# Patient Record
Sex: Male | Born: 1970 | Race: Black or African American | Hispanic: No | Marital: Single | Smoking: Never smoker
Health system: Southern US, Community
[De-identification: ages and names within clinical notes are randomized; demographics above are authoritative.]

## PROBLEM LIST (undated history)

## (undated) DIAGNOSIS — I10 Essential (primary) hypertension: Secondary | ICD-10-CM

## (undated) DIAGNOSIS — R7989 Other specified abnormal findings of blood chemistry: Secondary | ICD-10-CM

## (undated) DIAGNOSIS — E7800 Pure hypercholesterolemia, unspecified: Secondary | ICD-10-CM

## (undated) DIAGNOSIS — N529 Male erectile dysfunction, unspecified: Secondary | ICD-10-CM

## (undated) DIAGNOSIS — K7581 Nonalcoholic steatohepatitis (NASH): Secondary | ICD-10-CM

## (undated) DIAGNOSIS — E119 Type 2 diabetes mellitus without complications: Secondary | ICD-10-CM

## (undated) DIAGNOSIS — K219 Gastro-esophageal reflux disease without esophagitis: Secondary | ICD-10-CM

---

## 2011-09-04 ENCOUNTER — Emergency Department (HOSPITAL_BASED_OUTPATIENT_CLINIC_OR_DEPARTMENT_OTHER)
Admission: EM | Admit: 2011-09-04 | Discharge: 2011-09-04 | Disposition: A | Discharge status: Home / Self Care | Payer: Self-pay | Attending: Emergency Medicine | Admitting: Emergency Medicine

## 2011-09-04 ENCOUNTER — Encounter: Payer: Self-pay | Admitting: *Deleted

## 2011-09-04 DIAGNOSIS — K625 Hemorrhage of anus and rectum: Secondary | ICD-10-CM | POA: Insufficient documentation

## 2011-09-04 DIAGNOSIS — J329 Chronic sinusitis, unspecified: Secondary | ICD-10-CM | POA: Insufficient documentation

## 2011-09-04 DIAGNOSIS — R5381 Other malaise: Secondary | ICD-10-CM | POA: Insufficient documentation

## 2011-09-04 DIAGNOSIS — R51 Headache: Secondary | ICD-10-CM | POA: Insufficient documentation

## 2011-09-04 LAB — OCCULT BLOOD X 1 CARD TO LAB, STOOL: Fecal Occult Bld: NEGATIVE

## 2011-09-04 MED ORDER — CETIRIZINE-PSEUDOEPHEDRINE ER 5-120 MG PO TB12: 1.0000 | ORAL_TABLET | Freq: Every day | ORAL | Status: AC

## 2011-09-04 MED ORDER — LORATADINE 10 MG PO TABS
10.0000 mg | ORAL_TABLET | Freq: Once | ORAL | Status: AC
  Administered 2011-09-04: 10 mg via ORAL
  Filled 2011-09-04: qty 1

## 2011-09-04 MED ORDER — MOMETASONE FUROATE 50 MCG/ACT NA SUSP: 2.0000 | Freq: Every day | NASAL | Status: DC

## 2011-09-04 MED ORDER — AMOXICILLIN-POT CLAVULANATE 875-125 MG PO TABS: 1.0000 | ORAL_TABLET | Freq: Two times a day (BID) | ORAL | Status: AC

## 2011-09-04 MED ORDER — AMOXICILLIN-POT CLAVULANATE 875-125 MG PO TABS
1.0000 | ORAL_TABLET | Freq: Once | ORAL | Status: AC
  Administered 2011-09-04: 1 via ORAL
  Filled 2011-09-04: qty 1

## 2011-09-04 MED ORDER — GUAIFENESIN 200 MG PO TABS: 400.0000 mg | ORAL_TABLET | ORAL | Status: AC | PRN

## 2011-09-04 MED ORDER — GUAIFENESIN 100 MG/5ML PO SOLN
5.0000 mL | Freq: Once | ORAL | Status: AC
  Administered 2011-09-04: 100 mg via ORAL
  Filled 2011-09-04: qty 5

## 2011-09-04 NOTE — ED Provider Notes (Signed)
History     CSN: [id number] Arrival date & time: 09/04/2011  2:18 AM  Chief Complaint  Patient presents with  . Facial Pain  . Rectal Bleeding   HPI Comments: Previously healthy patient who presents to the emergency department with multiple complaints. His first complaint involves sinus congestion, facial pain, rhinorrhea, insomnia for the past 6 months. He states that he has tried several medications including azithromycin, several over-the-counter antihistamines and decongestants, NyQuil, and various other over-the-counter remedies. He has tried vitamin C and honey. Continues to have these symptoms which has resulted in insomnia which is why he seeks evaluation in the emergency department. He denies fevers, shortness of breath, productive cough. He also states that he's had intermittent hematochezia over the past 4 months with no abdominal or rectal pain. He noticed the blood on the toilet tissue as well as in the stool. No prior history of the same.  The history is provided by the patient. No language interpreter was used.    History reviewed. No pertinent past medical history.  History reviewed. No pertinent past surgical history.  History reviewed. No pertinent family history.  History  Substance Use Topics  . Smoking status: Never Smoker   . Smokeless tobacco: Not on file  . Alcohol Use: No      Review of Systems  Constitutional: Positive for fatigue. Negative for fever, chills, activity change and appetite change.  HENT: Positive for congestion, rhinorrhea, sneezing, postnasal drip and sinus pressure. Negative for ear pain, sore throat, neck pain, neck stiffness and ear discharge.   Eyes: Positive for discharge, redness and itching.  Respiratory: Negative for cough, chest tightness and shortness of breath.   Cardiovascular: Negative for chest pain and palpitations.  Gastrointestinal: Positive for blood in stool. Negative for nausea, vomiting, abdominal pain, diarrhea,  constipation and rectal pain.  Genitourinary: Negative for dysuria, urgency, frequency and flank pain.  Neurological: Negative for dizziness, weakness, light-headedness, numbness and headaches.  All other systems reviewed and are negative.    Physical Exam  BP 149/88  Pulse 69  Temp(Src) 97.5 F (36.4 C) (Oral)  SpO2 99%  Physical Exam  Nursing note and vitals reviewed. Constitutional: He is oriented to person, place, and time. He appears well-developed and well-nourished. No distress.  HENT:  Head: Normocephalic and atraumatic.  Right Ear: External ear normal.  Left Ear: External ear normal.  Mouth/Throat: Oropharynx is clear and moist. No oropharyngeal exudate.       Facial tenderness on palpation.  Bogginess to turbinates  Eyes: Pupils are equal, round, and reactive to light. Right eye exhibits no discharge. Left eye exhibits no discharge. Right conjunctiva is injected. Right conjunctiva has no hemorrhage. Left conjunctiva is injected. Left conjunctiva has no hemorrhage.  Neck: Normal range of motion. Neck supple.  Cardiovascular: Normal rate, regular rhythm, normal heart sounds and intact distal pulses.  Exam reveals no gallop and no friction rub.   No murmur heard. Pulmonary/Chest: Effort normal and breath sounds normal. No respiratory distress.  Abdominal: Soft. Bowel sounds are normal. There is no tenderness.  Genitourinary: Rectum normal and prostate normal. Rectal exam shows no external hemorrhoid, no internal hemorrhoid, no fissure and no tenderness. Guaiac negative stool.  Musculoskeletal: Normal range of motion. He exhibits no tenderness.  Neurological: He is alert and oriented to person, place, and time.  Skin: Skin is warm and dry. No rash noted.    ED Course  Procedures  MDM Intermittent hematochezia, sinusitis Hemoccults performed and negative. I reassured the  patient at this may be an ongoing issue and is likely secondary to constipation. He'll the patient is  suffering from sinusitis with an allergic component. I will place him on Augmentin prescribed him decongestants and Nasonex. He is instructed to followup with her primary care physician as this may be an ongoing issue. I stressed to him there is no indication for sleeping at this time and that Benadryl would be appropriate for his sleep. There is no emergent conditions at this time. There is no indication for chest x-ray the patient has no cough, fever. There is no indication for CBCs the patient has no rectal bleeding and has been minimal.      Trisha Mangle, MD 09/04/11 [phone]

## 2011-09-04 NOTE — ED Notes (Signed)
Pt states that he has had sinus congestion x 6 months pt also with small amount of blood in stools x 4 months ago denies abd pain or rectal pain

## 2017-12-16 ENCOUNTER — Emergency Department (HOSPITAL_BASED_OUTPATIENT_CLINIC_OR_DEPARTMENT_OTHER)
Admission: EM | Admit: 2017-12-16 | Discharge: 2017-12-16 | Disposition: A | Discharge status: Home / Self Care | Payer: Self-pay | Attending: Emergency Medicine | Admitting: Emergency Medicine

## 2017-12-16 ENCOUNTER — Encounter (HOSPITAL_BASED_OUTPATIENT_CLINIC_OR_DEPARTMENT_OTHER): Payer: Self-pay | Admitting: Emergency Medicine

## 2017-12-16 ENCOUNTER — Other Ambulatory Visit: Payer: Self-pay

## 2017-12-16 DIAGNOSIS — E119 Type 2 diabetes mellitus without complications: Secondary | ICD-10-CM | POA: Insufficient documentation

## 2017-12-16 DIAGNOSIS — R202 Paresthesia of skin: Secondary | ICD-10-CM | POA: Insufficient documentation

## 2017-12-16 LAB — BASIC METABOLIC PANEL
ANION GAP: 10 (ref 5–15)
BUN: 14 mg/dL (ref 6–20)
CHLORIDE: 93 mmol/L — AB (ref 101–111)
CO2: 28 mmol/L (ref 22–32)
Calcium: 9.4 mg/dL (ref 8.9–10.3)
Creatinine, Ser: 1.32 mg/dL — ABNORMAL HIGH (ref 0.61–1.24)
GFR calc Af Amer: >60 mL/min (ref >60)
Glucose, Bld: 462 mg/dL — ABNORMAL HIGH (ref 65–99)
POTASSIUM: 4.2 mmol/L (ref 3.5–5.1)
SODIUM: 131 mmol/L — AB (ref 135–145)

## 2017-12-16 LAB — CBC WITH DIFFERENTIAL/PLATELET
BASOS ABS: 0 10*3/uL (ref 0.0–0.1)
Basophils Relative: 0 %
Eosinophils Absolute: 0.2 10*3/uL (ref 0.0–0.7)
Eosinophils Relative: 3 %
HEMATOCRIT: 42.8 % (ref 39.0–52.0)
HEMOGLOBIN: 14.6 g/dL (ref 13.0–17.0)
Lymphocytes Relative: 35 %
Lymphs Abs: 2.6 10*3/uL (ref 0.7–4.0)
MCH: 28.5 pg (ref 26.0–34.0)
MCHC: 34.1 g/dL (ref 30.0–36.0)
MCV: 83.4 fL (ref 78.0–100.0)
Monocytes Absolute: 0.4 10*3/uL (ref 0.1–1.0)
Monocytes Relative: 6 %
NEUTROS ABS: 4.1 10*3/uL (ref 1.7–7.7)
NEUTROS PCT: 56 %
PLATELETS: 223 10*3/uL (ref 150–400)
RBC: 5.13 MIL/uL (ref 4.22–5.81)
RDW: 12.2 % (ref 11.5–15.5)
WBC: 7.3 10*3/uL (ref 4.0–10.5)

## 2017-12-16 LAB — URINALYSIS, ROUTINE W REFLEX MICROSCOPIC
Bilirubin Urine: NEGATIVE
Ketones, ur: 15 mg/dL — AB
LEUKOCYTES UA: NEGATIVE
Nitrite: NEGATIVE
Protein, ur: NEGATIVE mg/dL
SPECIFIC GRAVITY, URINE: 1.015 (ref 1.005–1.030)
pH: 5.5 (ref 5.0–8.0)

## 2017-12-16 LAB — URINALYSIS, MICROSCOPIC (REFLEX)

## 2017-12-16 MED ORDER — METFORMIN HCL 500 MG PO TABS: 500.0000 mg | ORAL_TABLET | Freq: Two times a day (BID) | ORAL | 0 refills | Status: DC

## 2017-12-16 NOTE — Discharge Instructions (Signed)
You were seen in the ED with symptoms concerning for diabetes. Your blood sugar was elevated and you need to be started on medication as well as making diet and exercise changes. You will need to see a PCP ASAP and I have listed several options for you on your discharge paperwork.   Return to the ED with any severe vomiting, abdominal pain, or other concerning symptoms.

## 2017-12-16 NOTE — ED Triage Notes (Signed)
PT presents with c/o blurred vision and tingling on entire face for a week.

## 2017-12-16 NOTE — ED Provider Notes (Signed)
Emergency Department Provider Note   I have reviewed the triage vital signs and the nursing notes.   HISTORY  Chief Complaint Blurred Vision and Tingling   HPI Craig Mclean is a 46 y.o. male to the emergency department for evaluation multiple complaints including dry mouth, urine frequency, blurred vision, lightheadedness, tingling over the middle of the face.  Symptoms have been worsening over the last week.  He denies any tingling or numbness on one side of the body or the other.  No fevers or chills.  No new medications.  Patient states that he was checked for diabetes 3 months ago and was told he was okay.  No pain in the arms or legs.  He states when he drinks food with lots of acid he feels pain in his mouth but otherwise is able to tolerate solids and liquids.  States he does drink candy but is been trying to eat more frequent recently.  No difficulty walking.   History reviewed. No pertinent past medical history.  There are no active problems to display for this patient.   History reviewed. No pertinent surgical history.  Current Outpatient Rx  . Order #: [id number] Class: Print  . Order #: [id number] Class: Print    Allergies Patient has no known allergies.  No family history on file.  Social History Social History   Tobacco Use  . Smoking status: Never Smoker  Substance Use Topics  . Alcohol use: No  . Drug use: No    Review of Systems  Constitutional: No fever/chills Eyes: Positive blurred vision.  ENT: No sore throat. Positive dry/painful mouth.  Cardiovascular: Denies chest pain. Respiratory: Denies shortness of breath. Gastrointestinal: No abdominal pain.  No nausea, no vomiting.  No diarrhea.  No constipation. Genitourinary: Negative for dysuria. Positive urine frequency.  Musculoskeletal: Negative for back pain. Skin: Negative for rash. Neurological: Negative for headaches, focal weakness or numbness. Positive face tingling.   10-point ROS  otherwise negative.  ____________________________________________   PHYSICAL EXAM:  VITAL SIGNS: ED Triage Vitals  Enc Vitals Group     BP 12/16/17 1918 (!) 133/108     Pulse Rate 12/16/17 1918 (!) 103     Resp 12/16/17 1918 20     Temp 12/16/17 1918 98 F (36.7 C)     Temp Source 12/16/17 1918 Oral     SpO2 12/16/17 1918 99 %     Pain Score 12/16/17 1933 0   Constitutional: Alert and oriented. Well appearing and in no acute distress. Eyes: Conjunctivae are normal.  Head: Atraumatic. Nose: No congestion/rhinnorhea. Mouth/Throat: Mucous membranes are dry. Neck: No stridor.  Cardiovascular: Tachycardia. Good peripheral circulation. Grossly normal heart sounds.   Respiratory: Normal respiratory effort.  No retractions. Lungs CTAB. Gastrointestinal: Soft and nontender. No distention.  Musculoskeletal: No lower extremity tenderness nor edema. No gross deformities of extremities. Neurologic:  Normal speech and language. No gross focal neurologic deficits are appreciated.  Skin:  Skin is warm, dry and intact. No rash noted.  ____________________________________________   LABS (all labs ordered are listed, but only abnormal results are displayed)  Labs Reviewed  BASIC METABOLIC PANEL - Abnormal; Notable for the following components:      Result Value   Sodium 131 (*)    Chloride 93 (*)    Glucose, Bld 462 (*)    Creatinine, Ser 1.32 (*)    All other components within normal limits  URINALYSIS, ROUTINE W REFLEX MICROSCOPIC - Abnormal; Notable for the following components:   Glucose,  UA >=500 (*)    Hgb urine dipstick TRACE (*)    Ketones, ur 15 (*)    All other components within normal limits  URINALYSIS, MICROSCOPIC (REFLEX) - Abnormal; Notable for the following components:   Bacteria, UA RARE (*)    Squamous Epithelial / LPF 0-5 (*)    All other components within normal limits  CBC WITH DIFFERENTIAL/PLATELET    ____________________________________________   PROCEDURES  Procedure(s) performed:   Procedures  None ____________________________________________   INITIAL IMPRESSION / ASSESSMENT AND PLAN / ED COURSE  Pertinent labs & imaging results that were available during my care of the patient were reviewed by me and considered in my medical decision making (see chart for details).  Patient has no focal neurological deficits.  He is describing tingling in his bilateral face.  No weakness or other cranial nerve deficit.  Equal strength bilaterally.  Normal gait.  Have somewhat increased suspicion for possible new onset diabetes.  Plan to obtain lab work and urine test as well as visual acuity.  Patient does not require any advanced imaging of the head at this time.   Patient with elevated CBG but no DKA. Plan to start Metformin and refer to PCP. Discussed diet changes and need for urgent follow up. Discussed signs/symptoms of DKA and return precautions in detail.   At this time, I do not feel there is any life-threatening condition present. I have reviewed and discussed all results (EKG, imaging, lab, urine as appropriate), exam findings with patient. I have reviewed nursing notes and appropriate previous records.  I feel the patient is safe to be discharged home without further emergent workup. Discussed usual and customary return precautions. Patient and family (if present) verbalize understanding and are comfortable with this plan.  Patient will follow-up with their primary care provider. If they do not have a primary care provider, information for follow-up has been provided to them. All questions have been answered.  ____________________________________________  FINAL CLINICAL IMPRESSION(S) / ED DIAGNOSES  Final diagnoses:  Type 2 diabetes mellitus without complication, without Keaja Reaume-term current use of insulin ([facility])     NEW OUTPATIENT MEDICATIONS STARTED DURING THIS  VISIT:  Metformin   Note:  This document was prepared using Dragon voice recognition software and may include unintentional dictation errors.  Nanda Quinton, MD Emergency Medicine    Kieth Hartis, Wonda Olds, MD 12/17/17 [phone]

## 2017-12-31 ENCOUNTER — Emergency Department (HOSPITAL_BASED_OUTPATIENT_CLINIC_OR_DEPARTMENT_OTHER)
Admission: EM | Admit: 2017-12-31 | Discharge: 2017-12-31 | Disposition: A | Discharge status: Home / Self Care | Payer: BLUE CROSS/BLUE SHIELD | Attending: Physician Assistant | Admitting: Physician Assistant

## 2017-12-31 ENCOUNTER — Encounter (HOSPITAL_BASED_OUTPATIENT_CLINIC_OR_DEPARTMENT_OTHER): Payer: Self-pay | Admitting: *Deleted

## 2017-12-31 ENCOUNTER — Other Ambulatory Visit: Payer: Self-pay

## 2017-12-31 ENCOUNTER — Emergency Department (HOSPITAL_BASED_OUTPATIENT_CLINIC_OR_DEPARTMENT_OTHER): Payer: BLUE CROSS/BLUE SHIELD

## 2017-12-31 DIAGNOSIS — R05 Cough: Secondary | ICD-10-CM | POA: Insufficient documentation

## 2017-12-31 DIAGNOSIS — Z79899 Other long term (current) drug therapy: Secondary | ICD-10-CM | POA: Diagnosis not present

## 2017-12-31 DIAGNOSIS — R059 Cough, unspecified: Secondary | ICD-10-CM

## 2017-12-31 MED ORDER — OMEPRAZOLE 20 MG PO CPDR: 20.0000 mg | DELAYED_RELEASE_CAPSULE | Freq: Every day | ORAL | 0 refills | Status: DC

## 2017-12-31 NOTE — ED Provider Notes (Signed)
[facility] EMERGENCY DEPARTMENT Provider Note   CSN: [id number] Arrival date & time: 12/31/17  0148     History   Chief Complaint Chief Complaint  Patient presents with  . Cough    HPI Craig Mclean is a 47 y.o. male.  HPI   Patient is a 47 year old male presenting with cough.  Patient reports the cough is during the day and night.  He has not noticed any congestion fever or other symptoms patient says he has been having more reflux symptoms..  When I went to evaluate patient he was very sleepy, unable to wake up and really talk to me at this time.  Most likely just secondary to be in the middle the night.  History reviewed. No pertinent past medical history.  There are no active problems to display for this patient.   History reviewed. No pertinent surgical history.     Home Medications    Prior to Admission medications   Medication Sig Start Date End Date Taking? Authorizing Provider  metFORMIN (GLUCOPHAGE) 500 MG tablet Take 1 tablet (500 mg total) by mouth 2 (two) times daily with a meal. 12/16/17 01/15/18 Yes Long, Wonda Olds, MD  mometasone (NASONEX) 50 MCG/ACT nasal spray Place 2 sprays into the nose daily. 09/04/11 09/03/12  Trisha Mangle, MD    Family History No family history on file.  Social History Social History   Tobacco Use  . Smoking status: Never Smoker  . Smokeless tobacco: Never Used  Substance Use Topics  . Alcohol use: No  . Drug use: No     Allergies   Patient has no known allergies.   Review of Systems Review of Systems  Constitutional: Negative for fatigue and fever.  HENT: Negative for congestion.   Respiratory: Positive for cough.      Physical Exam Updated Vital Signs BP (!) 151/95 (BP Location: Left Arm)   Pulse 84   Temp 98.2 F (36.8 C) (Oral)   Resp 20   SpO2 97%   Physical Exam  Constitutional: He is oriented to person, place, and time. He appears well-nourished.  HENT:  Head: Normocephalic.  Eyes:  Conjunctivae are normal. Right eye exhibits no discharge. Left eye exhibits no discharge.  Cardiovascular: Regular rhythm.  No murmur heard. Pulmonary/Chest: Effort normal and breath sounds normal. No respiratory distress.  Neurological: He is oriented to person, place, and time.  Skin: Skin is warm and dry. He is not diaphoretic.  Psychiatric: He has a normal mood and affect. His behavior is normal.     ED Treatments / Results  Labs (all labs ordered are listed, but only abnormal results are displayed) Labs Reviewed - No data to display  EKG  EKG Interpretation None       Radiology Dg Chest 2 View  Result Date: 12/31/2017 CLINICAL DATA:  47 y/o  M; cough and congestion. EXAM: CHEST  2 VIEW COMPARISON:  None. FINDINGS: The heart size and mediastinal contours are within normal limits. Both lungs are clear. The visualized skeletal structures are unremarkable. IMPRESSION: No active cardiopulmonary disease. Electronically Signed   By: Kristine Garbe M.D.   On: 12/31/2017 02:51    Procedures Procedures (including critical care time)  Medications Ordered in ED Medications - No data to display   Initial Impression / Assessment and Plan / ED Course  I have reviewed the triage vital signs and the nursing notes.  Pertinent labs & imaging results that were available during my care of the patient were  reviewed by me and considered in my medical decision making (see chart for details).      Patient is a 47 year old male presenting with cough.  Patient reports the cough is during the day and night.  He has not noticed any congestion fever or other symptoms patient says he has been having more reflux symptoms..  When I went to evaluate patient he was very sleepy, unable to wake up and really talk to me at this time.  Most likely just secondary to be in the middle the night.  Given that the cough has no other symptoms and x-rays negative I think is less likely to be infectious  as origin.  Could be that this is a cough related to GERD.  Will treat with omeprazole and have him follow-up with primary care physician.  Final Clinical Impressions(s) / ED Diagnoses   Final diagnoses:  None    ED Discharge Orders    None       Macarthur Critchley, MD 12/31/17 [phone]

## 2017-12-31 NOTE — ED Notes (Signed)
MD with pt  

## 2017-12-31 NOTE — Discharge Instructions (Signed)
Your chest x-ray was normal.  We think that your cough could be secondary to reflux.  Please use the medications provided to help control your symptoms monitor symptoms and return to follow-up with her primary care physician.

## 2017-12-31 NOTE — ED Triage Notes (Signed)
Cough x 1 week. Denies fever. Using OTC medications with minimal relief

## 2018-01-07 ENCOUNTER — Encounter (HOSPITAL_BASED_OUTPATIENT_CLINIC_OR_DEPARTMENT_OTHER): Payer: Self-pay | Admitting: Emergency Medicine

## 2018-01-07 ENCOUNTER — Other Ambulatory Visit: Payer: Self-pay

## 2018-01-07 ENCOUNTER — Emergency Department (HOSPITAL_BASED_OUTPATIENT_CLINIC_OR_DEPARTMENT_OTHER)
Admission: EM | Admit: 2018-01-07 | Discharge: 2018-01-08 | Disposition: A | Discharge status: Home / Self Care | Payer: BLUE CROSS/BLUE SHIELD | Attending: Emergency Medicine | Admitting: Emergency Medicine

## 2018-01-07 DIAGNOSIS — E1165 Type 2 diabetes mellitus with hyperglycemia: Secondary | ICD-10-CM | POA: Insufficient documentation

## 2018-01-07 DIAGNOSIS — R739 Hyperglycemia, unspecified: Secondary | ICD-10-CM

## 2018-01-07 DIAGNOSIS — Z79899 Other long term (current) drug therapy: Secondary | ICD-10-CM | POA: Insufficient documentation

## 2018-01-07 HISTORY — DX: Type 2 diabetes mellitus without complications: E11.9

## 2018-01-07 LAB — URINALYSIS, ROUTINE W REFLEX MICROSCOPIC
Bilirubin Urine: NEGATIVE
Glucose, UA: >=500 mg/dL — AB
HGB URINE DIPSTICK: NEGATIVE
Ketones, ur: NEGATIVE mg/dL
Leukocytes, UA: NEGATIVE
NITRITE: NEGATIVE
PROTEIN: NEGATIVE mg/dL
Specific Gravity, Urine: <1.005 — ABNORMAL LOW (ref 1.005–1.030)
pH: 6 (ref 5.0–8.0)

## 2018-01-07 LAB — CBG MONITORING, ED
GLUCOSE-CAPILLARY: 481 mg/dL — AB (ref 65–99)
Glucose-Capillary: 297 mg/dL — ABNORMAL HIGH (ref 65–99)

## 2018-01-07 LAB — URINALYSIS, MICROSCOPIC (REFLEX): RBC / HPF: NONE SEEN RBC/hpf (ref 0–5)

## 2018-01-07 LAB — BASIC METABOLIC PANEL
ANION GAP: 8 (ref 5–15)
BUN: 10 mg/dL (ref 6–20)
CALCIUM: 9 mg/dL (ref 8.9–10.3)
CO2: 28 mmol/L (ref 22–32)
Chloride: 96 mmol/L — ABNORMAL LOW (ref 101–111)
Creatinine, Ser: 1.2 mg/dL (ref 0.61–1.24)
GFR calc Af Amer: >60 mL/min (ref >60)
Glucose, Bld: 579 mg/dL (ref 65–99)
Potassium: 4.3 mmol/L (ref 3.5–5.1)
SODIUM: 132 mmol/L — AB (ref 135–145)

## 2018-01-07 LAB — CBC
HCT: 40.9 % (ref 39.0–52.0)
HEMOGLOBIN: 13.5 g/dL (ref 13.0–17.0)
MCH: 28.7 pg (ref 26.0–34.0)
MCHC: 33 g/dL (ref 30.0–36.0)
MCV: 86.8 fL (ref 78.0–100.0)
PLATELETS: 203 10*3/uL (ref 150–400)
RBC: 4.71 MIL/uL (ref 4.22–5.81)
RDW: 12.7 % (ref 11.5–15.5)
WBC: 4.8 10*3/uL (ref 4.0–10.5)

## 2018-01-07 MED ORDER — INSULIN REGULAR HUMAN 100 UNIT/ML IJ SOLN
7.0000 [IU] | Freq: Once | INTRAMUSCULAR | Status: AC
  Administered 2018-01-07: 7 [IU] via INTRAVENOUS
  Filled 2018-01-07: qty 1

## 2018-01-07 MED ORDER — SODIUM CHLORIDE 0.9 % IV BOLUS (SEPSIS)
1000.0000 mL | Freq: Once | INTRAVENOUS | Status: AC
  Administered 2018-01-07: 1000 mL via INTRAVENOUS

## 2018-01-07 MED ORDER — METFORMIN HCL 500 MG PO TABS: 500.0000 mg | ORAL_TABLET | Freq: Two times a day (BID) | ORAL | 0 refills | Status: DC

## 2018-01-07 NOTE — ED Notes (Signed)
Pt refuses to remain in the room. Going back and forth from room to waiting room.

## 2018-01-07 NOTE — ED Notes (Signed)
Date and time results received: 01/07/18 1853 (use smartphrase ".now" to insert current time)  Test: GLU Critical Value: 579  Name of Provider Notified: Dr Billy Fischer  Orders Received? Or Actions Taken?: Pt receiving IV fluids

## 2018-01-07 NOTE — ED Notes (Signed)
ED Provider at bedside. 

## 2018-01-07 NOTE — ED Triage Notes (Signed)
Pt sts needs refill on Metformin; out x 1 wk

## 2018-01-07 NOTE — ED Notes (Signed)
Alert, NAD, calm, interactive, resps e/u, speaking in clear complete sentences, no dyspnea noted, skin W&D, VSS, admits to some blurry vision, (denies: pain, sob, nausea, or dizziness).

## 2018-01-08 NOTE — ED Provider Notes (Signed)
[facility] EMERGENCY DEPARTMENT Provider Note   CSN: [id number] Arrival date & time: 01/07/18  1708     History   Chief Complaint Chief Complaint  Patient presents with  . Medication Refill  . Hyperglycemia    HPI Craig Mclean is a 47 y.o. male.  HPI   47 year old male with history of diabetes diagnosed weeks ago presents with increased urination, thirst, and blurred vision.  Reports that he ran out of his metformin prescription in about 2 weeks ago, and though he could adjust his diet and be ok,however he noted 4 days ago increased thirst and urination. Blurred vision is bilateral, no peripheral field def or double vision.  No fevers or other concerns. MIld cough for 2 weeks.  No dyspnea, no vomiting or nausea. No numbness, weakness, troublt walking or talking.  Past Medical History:  Diagnosis Date  . Diabetes mellitus without complication ([place])     There are no active problems to display for this patient.   History reviewed. No pertinent surgical history.     Home Medications    Prior to Admission medications   Medication Sig Start Date End Date Taking? Authorizing Provider  metFORMIN (GLUCOPHAGE) 500 MG tablet Take 1 tablet (500 mg total) by mouth 2 (two) times daily with a meal. 01/07/18 02/06/18  Gareth Morgan, MD  mometasone (NASONEX) 50 MCG/ACT nasal spray Place 2 sprays into the nose daily. 09/04/11 09/03/12  Trisha Mangle, MD  omeprazole (PRILOSEC) 20 MG capsule Take 1 capsule (20 mg total) by mouth daily. 12/31/17   Mackuen, Fredia Sorrow, MD    Family History No family history on file.  Social History Social History   Tobacco Use  . Smoking status: Never Smoker  . Smokeless tobacco: Never Used  Substance Use Topics  . Alcohol use: No  . Drug use: No     Allergies   Patient has no known allergies.   Review of Systems Review of Systems  Constitutional: Negative for fever.  HENT: Negative for sore throat.   Eyes: Positive for visual  disturbance.  Respiratory: Positive for cough. Negative for shortness of breath.   Cardiovascular: Negative for chest pain.  Gastrointestinal: Negative for abdominal pain, nausea and vomiting.  Endocrine: Positive for polydipsia and polyuria.  Genitourinary: Positive for frequency. Negative for difficulty urinating.  Musculoskeletal: Negative for back pain and neck stiffness.  Skin: Negative for rash.  Neurological: Negative for syncope, facial asymmetry, speech difficulty, weakness and headaches.     Physical Exam Updated Vital Signs BP (!) 130/102   Pulse 81   Temp 98.3 F (36.8 C) (Oral)   Resp 18   Ht 5' 11"  (1.803 m)   Wt 111.1 kg (245 lb)   SpO2 100%   BMI 34.17 kg/m   Physical Exam  Constitutional: He is oriented to person, place, and time. He appears well-developed and well-nourished. No distress.  HENT:  Head: Normocephalic and atraumatic.  Eyes: Conjunctivae and EOM are normal.  Neck: Normal range of motion.  Cardiovascular: Normal rate, regular rhythm, normal heart sounds and intact distal pulses. Exam reveals no gallop and no friction rub.  No murmur heard. Pulmonary/Chest: Effort normal and breath sounds normal. No respiratory distress. He has no wheezes. He has no rales.  Abdominal: Soft. He exhibits no distension. There is no tenderness. There is no guarding.  Musculoskeletal: He exhibits no edema.  Neurological: He is alert and oriented to person, place, and time. He has normal strength. No cranial nerve deficit or  sensory deficit. Coordination and gait normal. GCS eye subscore is 4. GCS verbal subscore is 5. GCS motor subscore is 6.  Skin: Skin is warm and dry. He is not diaphoretic.  Nursing note and vitals reviewed.    ED Treatments / Results  Labs (all labs ordered are listed, but only abnormal results are displayed) Labs Reviewed  BASIC METABOLIC PANEL - Abnormal; Notable for the following components:      Result Value   Sodium 132 (*)    Chloride  96 (*)    Glucose, Bld 579 (*)    All other components within normal limits  URINALYSIS, ROUTINE W REFLEX MICROSCOPIC - Abnormal; Notable for the following components:   Specific Gravity, Urine <1.005 (*)    Glucose, UA >=500 (*)    All other components within normal limits  URINALYSIS, MICROSCOPIC (REFLEX) - Abnormal; Notable for the following components:   Bacteria, UA RARE (*)    Squamous Epithelial / LPF 0-5 (*)    All other components within normal limits  CBG MONITORING, ED - Abnormal; Notable for the following components:   Glucose-Capillary >600 (*)    All other components within normal limits  CBG MONITORING, ED - Abnormal; Notable for the following components:   Glucose-Capillary 481 (*)    All other components within normal limits  CBG MONITORING, ED - Abnormal; Notable for the following components:   Glucose-Capillary 297 (*)    All other components within normal limits  CBC    EKG  EKG Interpretation None       Radiology No results found.  Procedures Procedures (including critical care time)  Medications Ordered in ED Medications  sodium chloride 0.9 % bolus 1,000 mL (0 mLs Intravenous Stopped 01/07/18 1927)  sodium chloride 0.9 % bolus 1,000 mL (0 mLs Intravenous Stopped 01/07/18 2050)  insulin regular (NOVOLIN R,HUMULIN R) 100 units/mL injection 7 Units (7 Units Intravenous Given 01/07/18 1956)     Initial Impression / Assessment and Plan / ED Course  I have reviewed the triage vital signs and the nursing notes.  Pertinent labs & imaging results that were available during my care of the patient were reviewed by me and considered in my medical decision making (see chart for details).     47 year old male with history of diabetes diagnosed weeks ago presents with increased urination, thirst, and blurred vision.   Labs show no evidence of DKA.  Given 2 L of normal saline for rehydration, and dose of insulin, with improvement of blood glucose.  Given  prescription for metformin.  Recommend close primary care physician follow-up.  Reports blurred vision has improved to recent baseline.  No other neuro symptoms, doubt CVA.  Patient discharged in stable condition with understanding of reasons to return.   Final Clinical Impressions(s) / ED Diagnoses   Final diagnoses:  Hyperglycemia    ED Discharge Orders        Ordered    metFORMIN (GLUCOPHAGE) 500 MG tablet  2 times daily with meals     01/07/18 2103       Gareth Morgan, MD 01/08/18 0345

## 2018-01-14 ENCOUNTER — Emergency Department (HOSPITAL_BASED_OUTPATIENT_CLINIC_OR_DEPARTMENT_OTHER)
Admission: EM | Admit: 2018-01-14 | Discharge: 2018-01-15 | Disposition: A | Discharge status: Home / Self Care | Payer: BLUE CROSS/BLUE SHIELD | Attending: Emergency Medicine | Admitting: Emergency Medicine

## 2018-01-14 ENCOUNTER — Encounter (HOSPITAL_BASED_OUTPATIENT_CLINIC_OR_DEPARTMENT_OTHER): Payer: Self-pay | Admitting: Emergency Medicine

## 2018-01-14 ENCOUNTER — Other Ambulatory Visit: Payer: Self-pay

## 2018-01-14 DIAGNOSIS — E1165 Type 2 diabetes mellitus with hyperglycemia: Secondary | ICD-10-CM | POA: Diagnosis not present

## 2018-01-14 DIAGNOSIS — H538 Other visual disturbances: Secondary | ICD-10-CM | POA: Diagnosis present

## 2018-01-14 DIAGNOSIS — R739 Hyperglycemia, unspecified: Secondary | ICD-10-CM

## 2018-01-14 LAB — CBG MONITORING, ED: Glucose-Capillary: 406 mg/dL — ABNORMAL HIGH (ref 65–99)

## 2018-01-14 NOTE — ED Triage Notes (Signed)
Reports diarrhea, blurred vision, dry mouth x "a couple days". States he started a new medication for DM.

## 2018-01-15 LAB — CBC WITH DIFFERENTIAL/PLATELET
BASOS ABS: 0 10*3/uL (ref 0.0–0.1)
Basophils Relative: 0 %
Eosinophils Absolute: 0.1 10*3/uL (ref 0.0–0.7)
Eosinophils Relative: 2 %
HEMATOCRIT: 39.3 % (ref 39.0–52.0)
Hemoglobin: 13 g/dL (ref 13.0–17.0)
LYMPHS ABS: 1.7 10*3/uL (ref 0.7–4.0)
LYMPHS PCT: 21 %
MCH: 28.6 pg (ref 26.0–34.0)
MCHC: 33.1 g/dL (ref 30.0–36.0)
MCV: 86.4 fL (ref 78.0–100.0)
MONO ABS: 0.5 10*3/uL (ref 0.1–1.0)
Monocytes Relative: 5 %
NEUTROS ABS: 6 10*3/uL (ref 1.7–7.7)
Neutrophils Relative %: 72 %
Platelets: 221 10*3/uL (ref 150–400)
RBC: 4.55 MIL/uL (ref 4.22–5.81)
RDW: 12.5 % (ref 11.5–15.5)
WBC: 8.3 10*3/uL (ref 4.0–10.5)

## 2018-01-15 LAB — BASIC METABOLIC PANEL
ANION GAP: 9 (ref 5–15)
BUN: 12 mg/dL (ref 6–20)
CALCIUM: 8.8 mg/dL — AB (ref 8.9–10.3)
CO2: 24 mmol/L (ref 22–32)
CREATININE: 0.88 mg/dL (ref 0.61–1.24)
Chloride: 102 mmol/L (ref 101–111)
Glucose, Bld: 360 mg/dL — ABNORMAL HIGH (ref 65–99)
Potassium: 3.7 mmol/L (ref 3.5–5.1)
SODIUM: 135 mmol/L (ref 135–145)

## 2018-01-15 MED ORDER — METFORMIN HCL 500 MG PO TABS: 1000.0000 mg | ORAL_TABLET | Freq: Two times a day (BID) | ORAL | 0 refills | Status: AC

## 2018-01-15 MED ORDER — SODIUM CHLORIDE 0.9 % IV BOLUS (SEPSIS)
1000.0000 mL | Freq: Once | INTRAVENOUS | Status: AC
  Administered 2018-01-15: 1000 mL via INTRAVENOUS

## 2018-01-15 MED ORDER — BLOOD GLUCOSE MONITOR KIT: PACK | 0 refills | Status: AC

## 2018-01-15 NOTE — ED Notes (Addendum)
Blood obtained. IV unsuccessful x3. 2nd RN to attempt.

## 2018-01-15 NOTE — ED Notes (Signed)
Alert, NAD, calm, interactive, resps e/u, speaking in clear complete sentences, no dyspnea noted, skin W&D, VSS, c/o hunger, thirst and blurry vision x 4d, (denies: pain, sob, nausea, dizziness or visual changes).

## 2018-01-15 NOTE — ED Provider Notes (Signed)
[place] EMERGENCY DEPARTMENT Provider Note   CSN: [id number] Arrival date & time: 01/14/18  2325     History   Chief Complaint Chief Complaint  Patient presents with  . Blurred Vision    HPI Craig Mclean is a 47 y.o. male.  HPI  This is a 47 year old male with a history of diabetes who presents with increased thirst and blurred vision.  He was seen and evaluated for the same thing on January 13.  At that time he had run out of his metformin and his symptoms were thought to be related to hyperglycemia.  She reports that he has been taking his metformin daily as directed.  He was initially evaluated for similar symptoms on December 22 and that was when he was started on metformin.  He has not seen a primary physician.  He states that overall his symptoms seem to get somewhat better when he takes his metformin but he has had some persistent vision changes that have not completely cleared.  He describes blurry vision.  Denies visual field cuts.  He also reports dry mouth and thirst.  Denies increased urination.  Denies any recent illnesses.  He has not been checking his blood sugars at home.  He does report diarrhea which she relates to the metformin.  Past Medical History:  Diagnosis Date  . Diabetes mellitus without complication ([place])     There are no active problems to display for this patient.   History reviewed. No pertinent surgical history.     Home Medications    Prior to Admission medications   Medication Sig Start Date End Date Taking? Authorizing Provider  blood glucose meter kit and supplies KIT Dispense based on patient and insurance preference. Use up to four times daily as directed. (FOR ICD-9 250.00, 250.01). 01/15/18   Horton, Barbette Hair, MD  metFORMIN (GLUCOPHAGE) 500 MG tablet Take 2 tablets (1,000 mg total) by mouth 2 (two) times daily with a meal. 01/15/18 02/14/18  Horton, Barbette Hair, MD  mometasone (NASONEX) 50 MCG/ACT nasal spray Place 2  sprays into the nose daily. 09/04/11 09/03/12  Trisha Mangle, MD  omeprazole (PRILOSEC) 20 MG capsule Take 1 capsule (20 mg total) by mouth daily. 12/31/17   Mackuen, Fredia Sorrow, MD    Family History No family history on file.  Social History Social History   Tobacco Use  . Smoking status: Never Smoker  . Smokeless tobacco: Never Used  Substance Use Topics  . Alcohol use: No  . Drug use: No     Allergies   Patient has no known allergies.   Review of Systems Review of Systems  Constitutional: Negative for fever.  Eyes: Positive for visual disturbance.  Respiratory: Negative for shortness of breath.   Cardiovascular: Negative for chest pain.  Gastrointestinal: Positive for diarrhea. Negative for abdominal pain, nausea and vomiting.  Endocrine: Positive for polydipsia. Negative for polyuria.  All other systems reviewed and are negative.    Physical Exam Updated Vital Signs BP 113/69   Pulse 83   Temp 98.6 F (37 C) (Oral)   Resp 17   Ht 5' 11"  (1.803 m)   Wt 111.1 kg (245 lb)   SpO2 98%   BMI 34.17 kg/m   Physical Exam  Constitutional: He is oriented to person, place, and time. He appears well-developed and well-nourished.  HENT:  Head: Normocephalic and atraumatic.  Mucous membranes dry  Eyes: Pupils are equal, round, and reactive to light.  Pupils 5 mm and  reactive bilaterally, no visual field cuts noted, 20/25 vision bilateral eyes  Cardiovascular: Normal rate, regular rhythm and normal heart sounds.  No murmur heard. Pulmonary/Chest: Effort normal and breath sounds normal. No respiratory distress. He has no wheezes.  Abdominal: Soft. Bowel sounds are normal. There is no tenderness. There is no rebound.  Musculoskeletal: He exhibits no edema.  Neurological: He is alert and oriented to person, place, and time.  Skin: Skin is warm and dry.  Psychiatric: He has a normal mood and affect.  Nursing note and vitals reviewed.    ED Treatments / Results   Labs (all labs ordered are listed, but only abnormal results are displayed) Labs Reviewed  BASIC METABOLIC PANEL - Abnormal; Notable for the following components:      Result Value   Glucose, Bld 360 (*)    Calcium 8.8 (*)    All other components within normal limits  CBG MONITORING, ED - Abnormal; Notable for the following components:   Glucose-Capillary 406 (*)    All other components within normal limits  CBC WITH DIFFERENTIAL/PLATELET    EKG  EKG Interpretation None       Radiology No results found.  Procedures Procedures (including critical care time)  Medications Ordered in ED Medications  sodium chloride 0.9 % bolus 1,000 mL (0 mLs Intravenous Stopped 01/15/18 0221)     Initial Impression / Assessment and Plan / ED Course  I have reviewed the triage vital signs and the nursing notes.  Pertinent labs & imaging results that were available during my care of the patient were reviewed by me and considered in my medical decision making (see chart for details).    Patient presents with persistent blurred vision and diarrhea.  He reports taking his metformin as directed.  He is persistently hyperglycemic here with blood sugars greater than 400.  Suspect his blurry vision is related to this as he has no visual field cuts.  Visual acuity is 20/25.  Basic lab work obtained and patient was given a liter of fluids.  Basic lab work shows no evidence of DKA.  Blood sugar 360 prior to fluid administration.  I discussed with the patient at length the importance of follow-up with primary physician.  He has Medicaid.  [facility] follow-up was recommended.  Increase metformin to 1 g twice daily and a prescription for glucometer given.  Recommend recording blood sugars twice daily.  We discussed diet modification and the importance of hydration.  I also have referred him for formal ophthalmology evaluation.  Patient stated understanding.  After history, exam, and medical workup I feel  the patient has been appropriately medically screened and is safe for discharge home. Pertinent diagnoses were discussed with the patient. Patient was given return precautions.  Final Clinical Impressions(s) / ED Diagnoses   Final diagnoses:  Hyperglycemia  Blurry vision, bilateral    ED Discharge Orders        Ordered    metFORMIN (GLUCOPHAGE) 500 MG tablet  2 times daily with meals     01/15/18 0222    blood glucose meter kit and supplies KIT     01/15/18 0222       Horton, Barbette Hair, MD 01/15/18 [phone]

## 2018-01-15 NOTE — Discharge Instructions (Signed)
You were seen today for dry mouth and blurry vision.  This is likely related to high blood sugars.  We will increase her metformin to 1000 mg twice daily.  You need to monitor glucoses and keep a record at least 2 times a day.  You also need to follow-up with ophthalmology for an official eye exam.  Make sure to stay hydrated.  Avoid carb rich foods.

## 2018-05-06 ENCOUNTER — Encounter (HOSPITAL_BASED_OUTPATIENT_CLINIC_OR_DEPARTMENT_OTHER): Payer: Self-pay | Admitting: Emergency Medicine

## 2018-05-06 ENCOUNTER — Emergency Department (HOSPITAL_BASED_OUTPATIENT_CLINIC_OR_DEPARTMENT_OTHER)
Admission: EM | Admit: 2018-05-06 | Discharge: 2018-05-06 | Disposition: A | Discharge status: Home / Self Care | Payer: BLUE CROSS/BLUE SHIELD | Attending: Emergency Medicine | Admitting: Emergency Medicine

## 2018-05-06 ENCOUNTER — Other Ambulatory Visit: Payer: Self-pay

## 2018-05-06 DIAGNOSIS — Z79899 Other long term (current) drug therapy: Secondary | ICD-10-CM | POA: Insufficient documentation

## 2018-05-06 DIAGNOSIS — M79644 Pain in right finger(s): Secondary | ICD-10-CM | POA: Diagnosis present

## 2018-05-06 DIAGNOSIS — E119 Type 2 diabetes mellitus without complications: Secondary | ICD-10-CM | POA: Diagnosis not present

## 2018-05-06 DIAGNOSIS — Z7984 Long term (current) use of oral hypoglycemic drugs: Secondary | ICD-10-CM | POA: Diagnosis not present

## 2018-05-06 DIAGNOSIS — L03011 Cellulitis of right finger: Secondary | ICD-10-CM | POA: Diagnosis not present

## 2018-05-06 MED ORDER — CEPHALEXIN 500 MG PO CAPS: 500.0000 mg | ORAL_CAPSULE | Freq: Four times a day (QID) | ORAL | 0 refills | Status: DC

## 2018-05-06 MED ORDER — HYDROCODONE-ACETAMINOPHEN 5-325 MG PO TABS: 1.0000 | ORAL_TABLET | Freq: Four times a day (QID) | ORAL | 0 refills | Status: DC | PRN

## 2018-05-06 MED ORDER — LIDOCAINE HCL (PF) 1 % IJ SOLN
5.0000 mL | Freq: Once | INTRAMUSCULAR | Status: DC
  Filled 2018-05-06: qty 5

## 2018-05-06 NOTE — ED Provider Notes (Signed)
[place] EMERGENCY DEPARTMENT Provider Note   CSN: [id number] Arrival date & time: 05/06/18  1309     History   Chief Complaint Chief Complaint  Patient presents with  . Hand Pain    HPI Craig Mclean is a 47 y.o. male.  The history is provided by the patient.  Hand Pain  This is a new problem. Episode onset: 4-5 days ago. The problem occurs constantly. The problem has been gradually worsening. Associated symptoms comments: Pain and throbbing in the right ring finger that radiates up into the wrist.  Swelling around the nail of the finger.  Denies any injury to the finger or any pain in the hand with palpation.  No fevers or other complaints at this time.. Exacerbated by: Palpation. Nothing relieves the symptoms. Treatments tried: elevation. The treatment provided no relief.    Past Medical History:  Diagnosis Date  . Diabetes mellitus without complication ([place])     There are no active problems to display for this patient.   History reviewed. No pertinent surgical history.      Home Medications    Prior to Admission medications   Medication Sig Start Date End Date Taking? Authorizing Provider  blood glucose meter kit and supplies KIT Dispense based on patient and insurance preference. Use up to four times daily as directed. (FOR ICD-9 250.00, 250.01). 01/15/18   Horton, Barbette Hair, MD  cephALEXin (KEFLEX) 500 MG capsule Take 1 capsule (500 mg total) by mouth 4 (four) times daily. 05/06/18   Blanchie Dessert, MD  HYDROcodone-acetaminophen (NORCO/VICODIN) 5-325 MG tablet Take 1 tablet by mouth every 6 (six) hours as needed for severe pain. 05/06/18   Blanchie Dessert, MD  metFORMIN (GLUCOPHAGE) 500 MG tablet Take 2 tablets (1,000 mg total) by mouth 2 (two) times daily with a meal. 01/15/18 02/14/18  Horton, Barbette Hair, MD  mometasone (NASONEX) 50 MCG/ACT nasal spray Place 2 sprays into the nose daily. 09/04/11 09/03/12  Trisha Mangle, MD  omeprazole (PRILOSEC) 20 MG  capsule Take 1 capsule (20 mg total) by mouth daily. 12/31/17   Mackuen, Fredia Sorrow, MD    Family History History reviewed. No pertinent family history.  Social History Social History   Tobacco Use  . Smoking status: Never Smoker  . Smokeless tobacco: Never Used  Substance Use Topics  . Alcohol use: No  . Drug use: No     Allergies   Patient has no known allergies.   Review of Systems Review of Systems  All other systems reviewed and are negative.    Physical Exam Updated Vital Signs BP (!) 153/97 (BP Location: Right Arm)   Pulse 73   Temp 98 F (36.7 C) (Oral)   Resp 19   Ht 5' 11"  (1.803 m)   Wt 117.9 kg (260 lb)   SpO2 100%   BMI 36.26 kg/m   Physical Exam  Constitutional: He is oriented to person, place, and time. He appears well-developed and well-nourished. No distress.  HENT:  Head: Normocephalic and atraumatic.  Eyes: Pupils are equal, round, and reactive to light.  Cardiovascular: Normal rate.  Pulmonary/Chest: Effort normal.  Musculoskeletal: He exhibits tenderness.       Hands: Neurological: He is alert and oriented to person, place, and time.  Nursing note and vitals reviewed.    ED Treatments / Results  Labs (all labs ordered are listed, but only abnormal results are displayed) Labs Reviewed - No data to display  EKG None  Radiology No results found.  Procedures Procedures (including critical care time) INCISION AND DRAINAGE Performed by: Blanchie Dessert Consent: Verbal consent obtained. Risks and benefits: risks, benefits and alternatives were discussed Type: abscess  Body area: Right ring fingernail  Anesthesia: None  Complexity: simple With 11 blade scapel cuticule of the nail was separated from the nail around the entire nail bed with copious amts of pus removed. Drainage: purulent  Drainage amount: 55m  Packing material: none Patient tolerance: Patient tolerated the procedure well with no immediate  complications.     Medications Ordered in ED Medications - No data to display   Initial Impression / Assessment and Plan / ED Course  I have reviewed the triage vital signs and the nursing notes.  Pertinent labs & imaging results that were available during my care of the patient were reviewed by me and considered in my medical decision making (see chart for details).     Patient presenting with a paronychia involving the right ring finger.  I&D as above with 3 to 4 mL's of purulent discharge.  Patient was placed on Keflex and instructed to continue doing warm soaks.  Encouraged to return if symptoms did not resolve  Final Clinical Impressions(s) / ED Diagnoses   Final diagnoses:  Paronychia of right ring finger    ED Discharge Orders        Ordered    cephALEXin (KEFLEX) 500 MG capsule  4 times daily     05/06/18 1510    HYDROcodone-acetaminophen (NORCO/VICODIN) 5-325 MG tablet  Every 6 hours PRN     05/06/18 1512       PBlanchie Dessert MD 05/06/18 1547

## 2018-05-06 NOTE — Discharge Instructions (Signed)
Soak finger in warm water for 42mn 2-3 times a day.  Take antibiotics as prescribed.  Return or call hand surgery if area is worsening.

## 2018-05-06 NOTE — ED Triage Notes (Signed)
Patient states that he hurt his right middle finger about 2 -3 days ago, he also reports that his right ring finger hurts as well. He is very unsure about if he injured it " I dont know it just started to hurt , I may have pinched it"

## 2018-08-23 ENCOUNTER — Emergency Department (HOSPITAL_BASED_OUTPATIENT_CLINIC_OR_DEPARTMENT_OTHER)
Admission: EM | Admit: 2018-08-23 | Discharge: 2018-08-23 | Disposition: A | Discharge status: Home / Self Care | Payer: BLUE CROSS/BLUE SHIELD | Attending: Emergency Medicine | Admitting: Emergency Medicine

## 2018-08-23 ENCOUNTER — Encounter (HOSPITAL_BASED_OUTPATIENT_CLINIC_OR_DEPARTMENT_OTHER): Payer: Self-pay

## 2018-08-23 DIAGNOSIS — E119 Type 2 diabetes mellitus without complications: Secondary | ICD-10-CM | POA: Insufficient documentation

## 2018-08-23 DIAGNOSIS — H538 Other visual disturbances: Secondary | ICD-10-CM | POA: Insufficient documentation

## 2018-08-23 DIAGNOSIS — R42 Dizziness and giddiness: Secondary | ICD-10-CM | POA: Insufficient documentation

## 2018-08-23 LAB — BASIC METABOLIC PANEL
Anion gap: 10 (ref 5–15)
BUN: 16 mg/dL (ref 6–20)
CO2: 25 mmol/L (ref 22–32)
Calcium: 8.8 mg/dL — ABNORMAL LOW (ref 8.9–10.3)
Chloride: 104 mmol/L (ref 98–111)
Creatinine, Ser: 0.86 mg/dL (ref 0.61–1.24)
GFR calc Af Amer: >60 mL/min (ref >60)
GFR calc non Af Amer: >60 mL/min (ref >60)
Glucose, Bld: 140 mg/dL — ABNORMAL HIGH (ref 70–99)
Potassium: 3.5 mmol/L (ref 3.5–5.1)
Sodium: 139 mmol/L (ref 135–145)

## 2018-08-23 LAB — CBC WITH DIFFERENTIAL/PLATELET
Basophils Absolute: 0 10*3/uL (ref 0.0–0.1)
Basophils Relative: 1 %
Eosinophils Absolute: 0.1 10*3/uL (ref 0.0–0.7)
Eosinophils Relative: 2 %
HCT: 40 % (ref 39.0–52.0)
Hemoglobin: 13.5 g/dL (ref 13.0–17.0)
Lymphocytes Relative: 27 %
Lymphs Abs: 1.5 10*3/uL (ref 0.7–4.0)
MCH: 29.3 pg (ref 26.0–34.0)
MCHC: 33.8 g/dL (ref 30.0–36.0)
MCV: 86.8 fL (ref 78.0–100.0)
Monocytes Absolute: 0.4 10*3/uL (ref 0.1–1.0)
Monocytes Relative: 6 %
Neutro Abs: 3.5 10*3/uL (ref 1.7–7.7)
Neutrophils Relative %: 64 %
Platelets: 214 10*3/uL (ref 150–400)
RBC: 4.61 MIL/uL (ref 4.22–5.81)
RDW: 13 % (ref 11.5–15.5)
WBC: 5.5 10*3/uL (ref 4.0–10.5)

## 2018-08-23 LAB — CBG MONITORING, ED: GLUCOSE-CAPILLARY: 162 mg/dL — AB (ref 70–99)

## 2018-08-23 LAB — URINALYSIS, ROUTINE W REFLEX MICROSCOPIC
Bilirubin Urine: NEGATIVE
Glucose, UA: 250 mg/dL — AB
Hgb urine dipstick: NEGATIVE
Ketones, ur: 15 mg/dL — AB
Leukocytes, UA: NEGATIVE
Nitrite: NEGATIVE
Protein, ur: 100 mg/dL — AB
Specific Gravity, Urine: >1.03 — ABNORMAL HIGH (ref 1.005–1.030)
pH: 5.5 (ref 5.0–8.0)

## 2018-08-23 LAB — URINALYSIS, MICROSCOPIC (REFLEX)

## 2018-08-23 MED ORDER — SODIUM CHLORIDE 0.9 % IV BOLUS
1000.0000 mL | Freq: Once | INTRAVENOUS | Status: AC
  Administered 2018-08-23: 1000 mL via INTRAVENOUS

## 2018-08-23 NOTE — ED Triage Notes (Signed)
Pt c/o blurred vision and dizziness off and on x2 days; states thought is was his sugar

## 2018-08-23 NOTE — Discharge Instructions (Signed)
Please follow up with Dr. Valetta Close (eye doctor) Continue to drink plenty of fluids Return if you are worsening (you pass out or your vision goes out)

## 2018-08-23 NOTE — ED Provider Notes (Signed)
[facility] [place] EMERGENCY DEPARTMENT Provider Note   CSN: [id number] Arrival date & time: 08/23/18  1033     History   Chief Complaint Chief Complaint  Patient presents with  . Dizziness    blurred vision    HPI Craig Mclean is a 47 y.o. male who presents with lightheadedness and blurry vision. He is a vague historian. He states he was in his usual state of health yesterday. He worked out and afterwards started feeling lightheaded and was having bilateral blurry vision. He states he just didn't feel well but can't elaborate. He cannot tell if it is worse with certain positions or positional changes. He is unsure if the blurry vision is related to the lightheadedness. Both symptoms are intermittent. He has had this before when his blood sugar has been high but today when it was checked in triage it was 162. He has been taking his diabetes medication but hasn't taken his blood pressure medicine in over a year because he checks it at home and feels he doesn't need it. He denies any pain - no headaches, eye pain, chest pain, SOB, abdominal pain currently. He was referred to an ophthalmologist but states "they never called me back". No syncope, fever, vomiting, dysuria, blood in the stool.  HPI  Past Medical History:  Diagnosis Date  . Diabetes mellitus without complication ([place])     There are no active problems to display for this patient.   History reviewed. No pertinent surgical history.      Home Medications    Prior to Admission medications   Medication Sig Start Date End Date Taking? Authorizing Provider  blood glucose meter kit and supplies KIT Dispense based on patient and insurance preference. Use up to four times daily as directed. (FOR ICD-9 250.00, 250.01). 01/15/18   Horton, Barbette Hair, MD  HYDROcodone-acetaminophen (NORCO/VICODIN) 5-325 MG tablet Take 1 tablet by mouth every 6 (six) hours as needed for severe pain. 05/06/18   Blanchie Dessert, MD  metFORMIN  (GLUCOPHAGE) 500 MG tablet Take 2 tablets (1,000 mg total) by mouth 2 (two) times daily with a meal. 01/15/18 02/14/18  Horton, Barbette Hair, MD    Family History No family history on file.  Social History Social History   Tobacco Use  . Smoking status: Never Smoker  . Smokeless tobacco: Never Used  Substance Use Topics  . Alcohol use: No  . Drug use: No     Allergies   Patient has no known allergies.   Review of Systems Review of Systems  Constitutional: Negative for fever.  Eyes: Positive for visual disturbance. Negative for pain.  Respiratory: Negative for shortness of breath.   Cardiovascular: Negative for chest pain.  Gastrointestinal: Negative for abdominal pain, nausea and vomiting.  Genitourinary: Negative for dysuria.  Musculoskeletal: Negative for back pain.  Neurological: Positive for dizziness and light-headedness. Negative for syncope and headaches.  All other systems reviewed and are negative.    Physical Exam Updated Vital Signs BP (!) 141/100 (BP Location: Right Arm)   Pulse 85   Temp 98.3 F (36.8 C) (Oral)   Resp 18   SpO2 97%   Physical Exam  Constitutional: He is oriented to person, place, and time. He appears well-developed and well-nourished. No distress.  HENT:  Head: Normocephalic and atraumatic.  Eyes: Pupils are equal, round, and reactive to light. Conjunctivae are normal. Right eye exhibits no discharge. Left eye exhibits no discharge. No scleral icterus.  Corneal arcus bilaterally    Visual  Acuity  Right Eye Distance: 20/30 Left Eye Distance: 20/30 Bilateral Distance: 20/25     Neck: Normal range of motion.  Cardiovascular: Normal rate and regular rhythm.  Pulmonary/Chest: Effort normal and breath sounds normal. No respiratory distress.  Abdominal: He exhibits no distension.  Neurological: He is alert and oriented to person, place, and time.  Lying on stretcher in NAD. GCS 15. Speaks in a clear voice. Cranial nerves II through XII  grossly intact. 5/5 strength in all extremities. Sensation fully intact.  Bilateral finger-to-nose intact. Ambulatory. Reports increased dizziness with standing and walking  Skin: Skin is warm and dry.  Psychiatric: He has a normal mood and affect. His behavior is normal.  Nursing note and vitals reviewed.    ED Treatments / Results  Labs (all labs ordered are listed, but only abnormal results are displayed) Labs Reviewed  URINALYSIS, ROUTINE W REFLEX MICROSCOPIC - Abnormal; Notable for the following components:      Result Value   Specific Gravity, Urine >1.030 (*)    Glucose, UA 250 (*)    Ketones, ur 15 (*)    Protein, ur 100 (*)    All other components within normal limits  BASIC METABOLIC PANEL - Abnormal; Notable for the following components:   Glucose, Bld 140 (*)    Calcium 8.8 (*)    All other components within normal limits  URINALYSIS, MICROSCOPIC (REFLEX) - Abnormal; Notable for the following components:   Bacteria, UA RARE (*)    All other components within normal limits  CBG MONITORING, ED - Abnormal; Notable for the following components:   Glucose-Capillary 162 (*)    All other components within normal limits  CBC WITH DIFFERENTIAL/PLATELET    EKG None  Radiology No results found.  Procedures Procedures (including critical care time)  Medications Ordered in ED Medications  sodium chloride 0.9 % bolus 1,000 mL (0 mLs Intravenous Stopped 08/23/18 1345)     Initial Impression / Assessment and Plan / ED Course  I have reviewed the triage vital signs and the nursing notes.  Pertinent labs & imaging results that were available during my care of the patient were reviewed by me and considered in my medical decision making (see chart for details).  47 year old male with vague symptoms of dizziness/lightheaded, blurry vision and feeling unwell. He is hypertensive but otherwise vitals are normal. Orthostatics are negative. His exam is unremarkable. No neuro  deficits. CBC is normal. BMP is remarkable for mild hyperglycemia. UA has 15 ketones, 100 protein, >1.030 specific gravity. EKG is SR. He was given 1 L of fluids and feels mildly better. He reports blurry vision however visual acuity is intact. He was strongly encouraged to f/u with ophthalmology. He was given another referral and asked to hydrate. Return precautions given.  Final Clinical Impressions(s) / ED Diagnoses   Final diagnoses:  Lightheaded  Blurry vision, bilateral    ED Discharge Orders    None       Recardo Evangelist, PA-C 08/23/18 1438    Davonna Belling, MD 08/23/18 [phone]

## 2018-12-23 ENCOUNTER — Emergency Department (HOSPITAL_BASED_OUTPATIENT_CLINIC_OR_DEPARTMENT_OTHER): Payer: Self-pay

## 2018-12-23 ENCOUNTER — Emergency Department (HOSPITAL_BASED_OUTPATIENT_CLINIC_OR_DEPARTMENT_OTHER)
Admission: EM | Admit: 2018-12-23 | Discharge: 2018-12-23 | Disposition: A | Discharge status: Home / Self Care | Payer: Self-pay | Attending: Emergency Medicine | Admitting: Emergency Medicine

## 2018-12-23 ENCOUNTER — Encounter (HOSPITAL_BASED_OUTPATIENT_CLINIC_OR_DEPARTMENT_OTHER): Payer: Self-pay | Admitting: Emergency Medicine

## 2018-12-23 ENCOUNTER — Other Ambulatory Visit: Payer: Self-pay

## 2018-12-23 DIAGNOSIS — I159 Secondary hypertension, unspecified: Secondary | ICD-10-CM | POA: Insufficient documentation

## 2018-12-23 DIAGNOSIS — H538 Other visual disturbances: Secondary | ICD-10-CM | POA: Insufficient documentation

## 2018-12-23 DIAGNOSIS — E119 Type 2 diabetes mellitus without complications: Secondary | ICD-10-CM | POA: Insufficient documentation

## 2018-12-23 LAB — CBC WITH DIFFERENTIAL/PLATELET
Abs Immature Granulocytes: 0 10*3/uL (ref 0.00–0.07)
BASOS PCT: 0 %
Basophils Absolute: 0 10*3/uL (ref 0.0–0.1)
EOS PCT: 2 %
Eosinophils Absolute: 0.1 10*3/uL (ref 0.0–0.5)
HCT: 46.6 % (ref 39.0–52.0)
Hemoglobin: 14.5 g/dL (ref 13.0–17.0)
Immature Granulocytes: 0 %
Lymphocytes Relative: 34 %
Lymphs Abs: 2.2 10*3/uL (ref 0.7–4.0)
MCH: 27.5 pg (ref 26.0–34.0)
MCHC: 31.1 g/dL (ref 30.0–36.0)
MCV: 88.3 fL (ref 80.0–100.0)
MONO ABS: 0.3 10*3/uL (ref 0.1–1.0)
MONOS PCT: 4 %
Neutro Abs: 3.8 10*3/uL (ref 1.7–7.7)
Neutrophils Relative %: 60 %
PLATELETS: 202 10*3/uL (ref 150–400)
RBC: 5.28 MIL/uL (ref 4.22–5.81)
RDW: 12 % (ref 11.5–15.5)
WBC: 6.4 10*3/uL (ref 4.0–10.5)
nRBC: 0 % (ref 0.0–0.2)

## 2018-12-23 LAB — BASIC METABOLIC PANEL
Anion gap: 10 (ref 5–15)
BUN: 18 mg/dL (ref 6–20)
CALCIUM: 8.8 mg/dL — AB (ref 8.9–10.3)
CO2: 26 mmol/L (ref 22–32)
CREATININE: 1.29 mg/dL — AB (ref 0.61–1.24)
Chloride: 100 mmol/L (ref 98–111)
GFR calc Af Amer: >60 mL/min (ref >60)
GFR calc non Af Amer: >60 mL/min (ref >60)
GLUCOSE: 324 mg/dL — AB (ref 70–99)
Potassium: 4.1 mmol/L (ref 3.5–5.1)
SODIUM: 136 mmol/L (ref 135–145)

## 2018-12-23 LAB — CBG MONITORING, ED: GLUCOSE-CAPILLARY: 324 mg/dL — AB (ref 70–99)

## 2018-12-23 MED ORDER — SODIUM CHLORIDE 0.9 % IV BOLUS
500.0000 mL | Freq: Once | INTRAVENOUS | Status: AC
  Administered 2018-12-23: 500 mL via INTRAVENOUS

## 2018-12-23 NOTE — ED Notes (Signed)
Pt denies having any double-vision at this time.

## 2018-12-23 NOTE — Discharge Instructions (Signed)
As we discussed, though you do have high blood pressure (hypertension), fortunately it is not immediately dangerous at this time and does not need emergency intervention or admission to the hospital.  If we add to or change your regular medications, we may cause more harm than good - it is more appropriate for your primary care doctor to evaluate you in clinic and decide if any medication changes are needed.  Please follow up in clinic as recommended in these papers.    Call the eye doctor tomorrow to schedule a follow up appointment   Return to the Emergency Department (ED) if you experience any worsening chest pain/pressure/tightness, difficulty breathing, or sudden sweating, or other symptoms that concern you.   Hypertension Hypertension, commonly called high blood pressure, is when the force of blood pumping through your arteries is too strong. Your arteries are the blood vessels that carry blood from your heart throughout your body. A blood pressure reading consists of a higher number over a lower number, such as 110/72. The higher number (systolic) is the pressure inside your arteries when your heart pumps. The lower number (diastolic) is the pressure inside your arteries when your heart relaxes. Ideally you want your blood pressure below 120/80. Hypertension forces your heart to work harder to pump blood. Your arteries may become narrow or stiff. Having hypertension puts you at risk for heart disease, stroke, and other problems.  RISK FACTORS Some risk factors for high blood pressure are controllable. Others are not.  Risk factors you cannot control include:  Race. You may be at higher risk if you are African American. Age. Risk increases with age. Gender. Men are at higher risk than women before age 5 years. After age 61, women are at higher risk than men. Risk factors you can control include: Not getting enough exercise or physical activity. Being overweight. Getting too much fat, sugar,  calories, or salt in your diet. Drinking too much alcohol. SIGNS AND SYMPTOMS Hypertension does not usually cause signs or symptoms. Extremely high blood pressure (hypertensive crisis) may cause headache, anxiety, shortness of breath, and nosebleed. DIAGNOSIS  To check if you have hypertension, your health care provider will measure your blood pressure while you are seated, with your arm held at the level of your heart. It should be measured at least twice using the same arm. Certain conditions can cause a difference in blood pressure between your right and left arms. A blood pressure reading that is higher than normal on one occasion does not mean that you need treatment. If one blood pressure reading is high, ask your health care provider about having it checked again. TREATMENT  Treating high blood pressure includes making lifestyle changes and possibly taking medicine. Living a healthy lifestyle can help lower high blood pressure. You may need to change some of your habits. Lifestyle changes may include: Following the DASH diet. This diet is high in fruits, vegetables, and whole grains. It is low in salt, red meat, and added sugars. Getting at least 2 hours of brisk physical activity every week. Losing weight if necessary. Not smoking. Limiting alcoholic beverages. Learning ways to reduce stress.  If lifestyle changes are not enough to get your blood pressure under control, your health care provider may prescribe medicine. You may need to take more than one. Work closely with your health care provider to understand the risks and benefits. HOME CARE INSTRUCTIONS Have your blood pressure rechecked as directed by your health care provider.   Take medicines  only as directed by your health care provider. Follow the directions carefully. Blood pressure medicines must be taken as prescribed. The medicine does not work as well when you skip doses. Skipping doses also puts you at risk for problems.     Do not smoke.   Monitor your blood pressure at home as directed by your health care provider.  SEEK MEDICAL CARE IF:  You think you are having a reaction to medicines taken. You have recurrent headaches or feel dizzy. You have swelling in your ankles. You have trouble with your vision. SEEK IMMEDIATE MEDICAL CARE IF: You develop a severe headache or confusion. You have unusual weakness, numbness, or feel faint. You have severe chest or abdominal pain. You vomit repeatedly. You have trouble breathing. MAKE SURE YOU:  Understand these instructions. Will watch your condition. Will get help right away if you are not doing well or get worse. Document Released: 12/12/2005 Document Revised: 04/28/2014 Document Reviewed: 10/04/2013 [facility] Patient Information 2015 [place], [place]. This information is not intended to replace advice given to you by your health care provider. Make sure you discuss any questions you have with your health care provider.  How to Take Your Blood Pressure HOW DO I GET A BLOOD PRESSURE MACHINE? You can buy an electronic home blood pressure machine at your local pharmacy. Insurance will sometimes cover the cost if you have a prescription. Ask your doctor what type of machine is best for you. There are different machines for your arm and your wrist. If you decide to buy a machine to check your blood pressure on your arm, first check the size of your arm so you can buy the right size cuff. To check the size of your arm:   Use a measuring tape that shows both inches and centimeters.   Wrap the measuring tape around the upper-middle part of your arm. You may need someone to help you measure.   Write down your arm measurement in both inches and centimeters.   To measure your blood pressure correctly, it is important to have the right size cuff.   If your arm is up to 13 inches (up to 34 centimeters), get an adult cuff size. If your arm is 13 to 17 inches (35 to 44  centimeters), get a large adult cuff size.    If your arm is 17 to 20 inches (45 to 52 centimeters), get an adult thigh cuff.   WHAT DO THE NUMBERS MEAN?  There are two numbers that make up your blood pressure. For example: 120/80. The first number (120 in our example) is called the "systolic pressure." It is a measure of the pressure in your blood vessels when your heart is pumping blood. The second number (80 in our example) is called the "diastolic pressure." It is a measure of the pressure in your blood vessels when your heart is resting between beats. Your doctor will tell you what your blood pressure should be. WHAT SHOULD I DO BEFORE I CHECK MY BLOOD PRESSURE?  Try to rest or relax for at least 30 minutes before you check your blood pressure. Do not smoke. Do not have any drinks with caffeine, such as: Soda. Coffee. Tea. Check your blood pressure in a quiet room. Sit down and stretch out your arm on a table. Keep your arm at about the level of your heart. Let your arm relax. Make sure that your legs are not crossed. HOW DO I CHECK MY BLOOD PRESSURE? Follow the directions  that came with your machine. Make sure you remove any tight-fighting clothing from your arm or wrist. Wrap the cuff around your upper arm or wrist. You should be able to fit a finger between the cuff and your arm. If you cannot fit a finger between the cuff and your arm, it is too tight and should be removed and rewrapped. Some units require you to manually pump up the arm cuff. Automatic units inflate the cuff when you press a button. Cuff deflation is automatic in both models. After the cuff is inflated, the unit measures your blood pressure and pulse. The readings are shown on a monitor. Hold still and breathe normally while the cuff is inflated. Getting a reading takes less than a minute. Some models store readings in a memory. Some provide a printout of readings. If your machine does not store your readings, keep  a written record. Take readings with you to your next visit with your doctor. Document Released: 11/24/2008 Document Revised: 04/28/2014 Document Reviewed: 02/06/2014 [facility] Patient Information 2015 [place], [place]. This information is not intended to replace advice given to you by your health care provider. Make sure you discuss any questions you have with your health care provider.

## 2018-12-23 NOTE — ED Triage Notes (Signed)
Pt reports double vision x 2 days. Took his BP and found it to be high. States he no longer takes BP meds. Denies pain.

## 2018-12-23 NOTE — ED Provider Notes (Signed)
Emergency Department Provider Note   I have reviewed the triage vital signs and the nursing notes.   HISTORY  Chief Complaint Hypertension and Diplopia   HPI Craig Mclean is a 47 y.o. male with PMH of DM and HTN presents to the emergency department for evaluation of vision change and levator blood pressure.  Patient noticed his blood pressure was higher 2 days ago.  He said he felt like his pressure might be getting high so he checked it and it was in the 160s.  He states that he is intermittently noticed some blurry vision but also double vision at times.  Most of the double vision is objects which are far away.  He does not have double vision with objects closer to him.  Not worse with looking one direction or the other.  He denies any headache or eye pain.  No fevers or chills.   Past Medical History:  Diagnosis Date  . Diabetes mellitus without complication ([place])     There are no active problems to display for this patient.   History reviewed. No pertinent surgical history.  Allergies Patient has no known allergies.  No family history on file.  Social History Social History   Tobacco Use  . Smoking status: Never Smoker  . Smokeless tobacco: Never Used  Substance Use Topics  . Alcohol use: No  . Drug use: No    Review of Systems  Constitutional: No fever/chills Eyes: Positive double vision.  ENT: No sore throat. Cardiovascular: Denies chest pain. Positive elevated BP.  Respiratory: Denies shortness of breath. Gastrointestinal: No abdominal pain.  No nausea, no vomiting.  No diarrhea.  No constipation. Genitourinary: Negative for dysuria. Musculoskeletal: Negative for back pain. Skin: Negative for rash. Neurological: Negative for headaches, focal weakness or numbness.  10-point ROS otherwise negative.  ____________________________________________   PHYSICAL EXAM:  VITAL SIGNS: ED Triage Vitals  Enc Vitals Group     BP 12/23/18 1030 (!) 131/99     Pulse Rate 12/23/18 1030 99     Resp 12/23/18 1030 18     Temp 12/23/18 1030 98.1 F (36.7 C)     Temp Source 12/23/18 1030 Oral     SpO2 12/23/18 1030 99 %     Weight 12/23/18 1029 265 lb (120.2 kg)     Height 12/23/18 1029 5' 11"  (1.803 m)     Pain Score 12/23/18 1030 0   Constitutional: Alert and oriented. Well appearing and in no acute distress. Eyes: Conjunctivae are normal. PERRL. EOMI. Head: Atraumatic. Nose: No congestion/rhinnorhea. Mouth/Throat: Mucous membranes are moist. Neck: No stridor.  Cardiovascular: Normal rate, regular rhythm. Good peripheral circulation. Grossly normal heart sounds.   Respiratory: Normal respiratory effort.  No retractions. Lungs CTAB. Gastrointestinal: Soft and nontender. No distention.  Musculoskeletal: No lower extremity tenderness nor edema. No gross deformities of extremities. Neurologic:  Normal speech and language. No gross focal neurologic deficits are appreciated. Normal CN exam 2-12.  Skin:  Skin is warm, dry and intact. No rash noted.   ____________________________________________   LABS (all labs ordered are listed, but only abnormal results are displayed)  Labs Reviewed  BASIC METABOLIC PANEL - Abnormal; Notable for the following components:      Result Value   Glucose, Bld 324 (*)    Creatinine, Ser 1.29 (*)    Calcium 8.8 (*)    All other components within normal limits  CBG MONITORING, ED - Abnormal; Notable for the following components:   Glucose-Capillary 324 (*)  All other components within normal limits  CBC WITH DIFFERENTIAL/PLATELET   ____________________________________________  EKG  None completed ____________________________________________  RADIOLOGY  Ct Head Wo Contrast  Result Date: 12/23/2018 CLINICAL DATA:  47 year old with double vision.  Hypertensive. EXAM: CT HEAD WITHOUT CONTRAST TECHNIQUE: Contiguous axial images were obtained from the base of the skull through the vertex without  intravenous contrast. COMPARISON:  None. FINDINGS: Brain: No evidence for acute hemorrhage, mass lesion, midline shift, hydrocephalus or large infarct. Vascular: No hyperdense vessel or unexpected calcification. Skull: Normal. Negative for fracture or focal lesion. Sinuses/Orbits: Mucosal thickening in the ethmoid air cells and sphenoid sinuses. Opacification in the small frontal sinuses. Other: None. IMPRESSION: No acute intracranial abnormality. Paranasal sinus disease. Electronically Signed   By: Markus Daft M.D.   On: 12/23/2018 11:30    ____________________________________________   PROCEDURES  Procedure(s) performed:   Procedures  None ____________________________________________   INITIAL IMPRESSION / ASSESSMENT AND PLAN / ED COURSE  Pertinent labs & imaging results that were available during my care of the patient were reviewed by me and considered in my medical decision making (see chart for details).  Patient presents to the emergency department for evaluation of double vision with elevated blood pressure.  No focal neuro deficits.  Extraocular movements intact without extraocular abnormality. No diplopia at this time. Symptoms on description seem more like blurry vision when looking far away as opposed to true diplopia. Visual acuity 20/70 bilaterally here. Labs and CT head are negative for acute abnormality. No DKA. No evidence of hypertensive emergency. Patient to continue Metformin and HTN meds. WIll dry diet modification and f/u with both PCP and ophthalmology. Discussed ED return precautions in detail.    ____________________________________________  FINAL CLINICAL IMPRESSION(S) / ED DIAGNOSES  Final diagnoses:  Secondary hypertension  Blurry vision, bilateral     MEDICATIONS GIVEN DURING THIS VISIT:  Medications  sodium chloride 0.9 % bolus 500 mL (0 mLs Intravenous Stopped 12/23/18 1309)    Note:  This document was prepared using Dragon voice recognition  software and may include unintentional dictation errors.  Nanda Quinton, MD Emergency Medicine    Long, [facility], MD 12/23/18 [phone]

## 2018-12-23 NOTE — ED Notes (Signed)
Attempt IV placement.  Unsuccessful.  Was able to draw small amount of blood for green top and took this to lab.  Second staff member to attempt IV placement.

## 2019-11-23 IMAGING — CT CT HEAD W/O CM
3 of 6 series · 16 of 47 positions shown, 19 images · non-contrast
Comparison: None.

CLINICAL DATA: 47-year-old with double vision.  Hypertensive.

EXAM:
CT HEAD WITHOUT CONTRAST
TECHNIQUE: Contiguous axial images were obtained from the base of the skull
through the vertex without intravenous contrast.

[Series 2: head wo · axial · 0.48mm/px · z∈[-138,+2]mm · 11 of 32 slices shown, 14 images]
[im 2/32  brain]
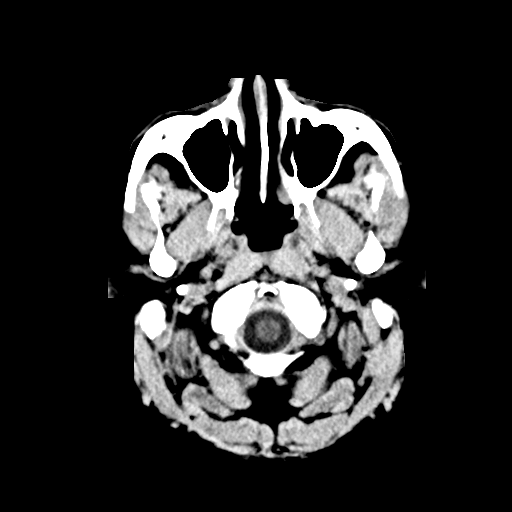
[im 2/32  bone]
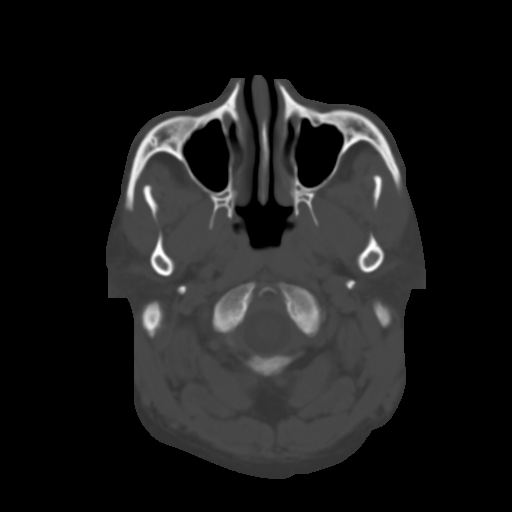
[im 5/32  brain]
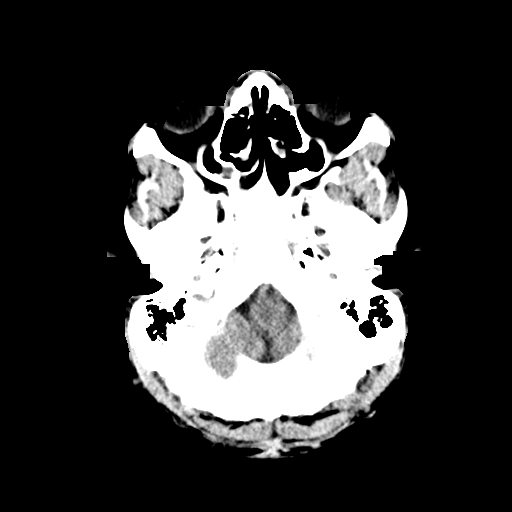
[im 8/32  brain]
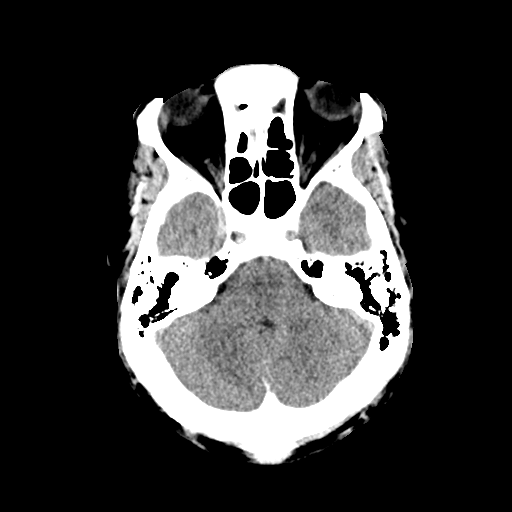
[im 11/32  brain]
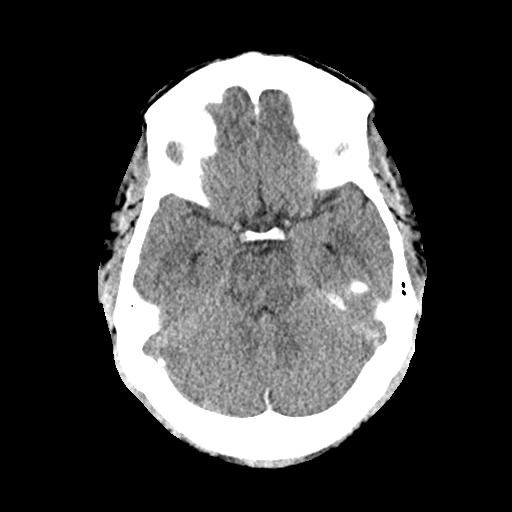
[im 13/32  brain]
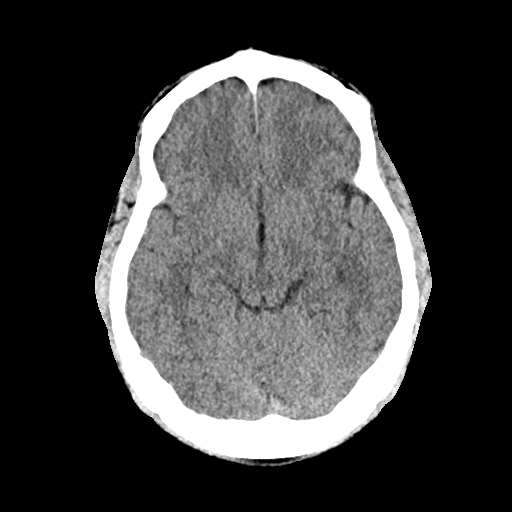
[im 13/32  bone]
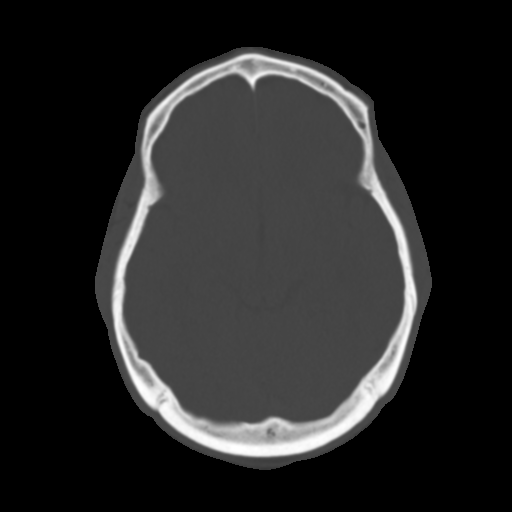
[im 16/32  brain]
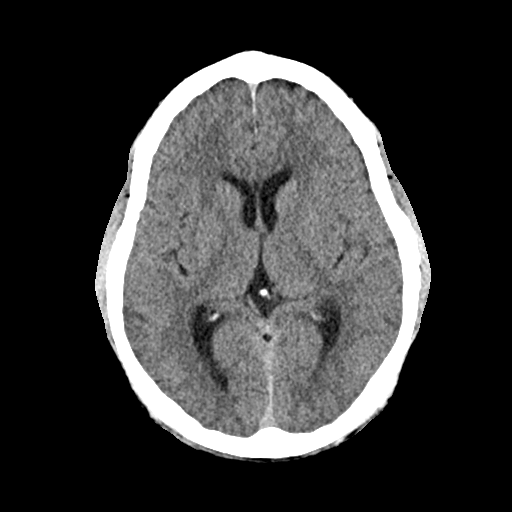
[im 19/32  brain]
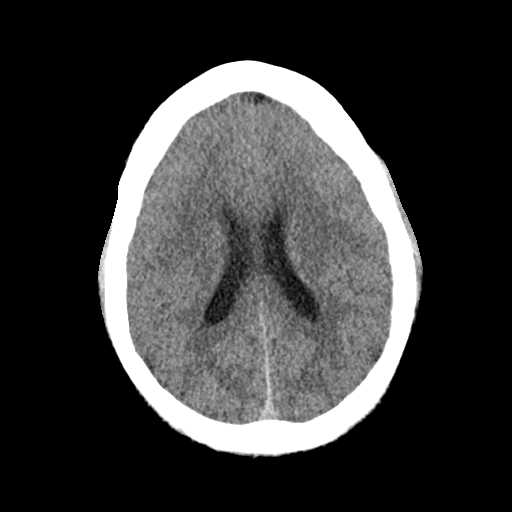
[im 21/32  brain]
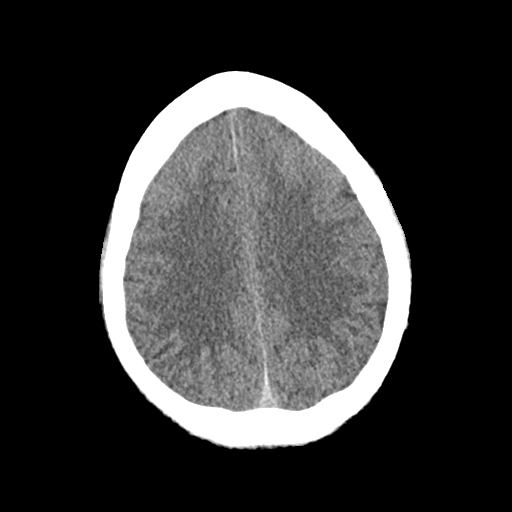
[im 24/32  brain]
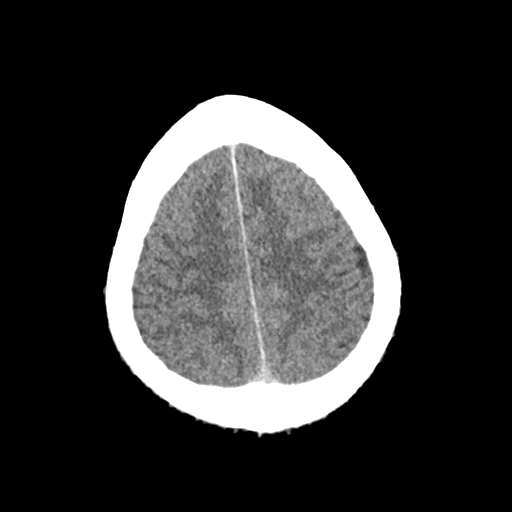
[im 24/32  bone]
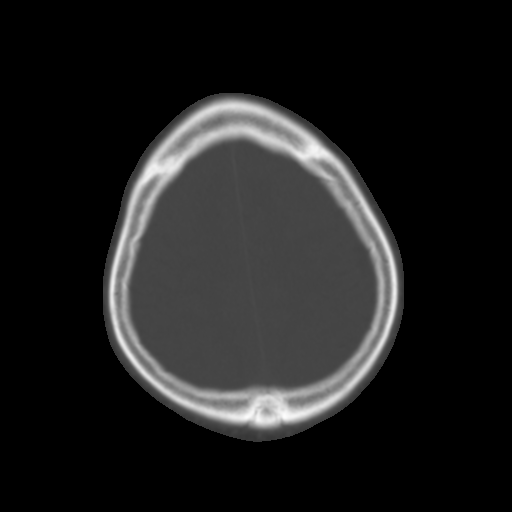
[im 27/32  brain]
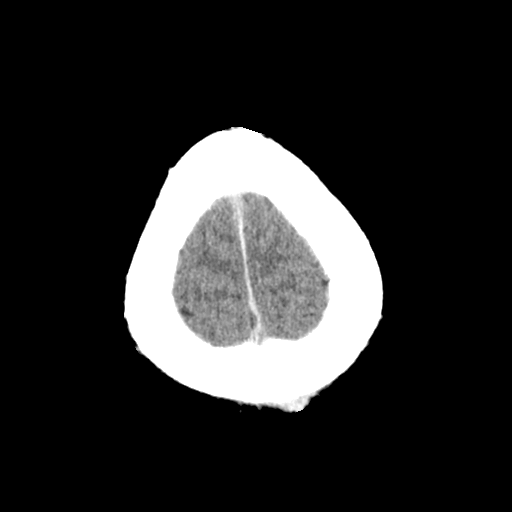
[im 30/32  brain]
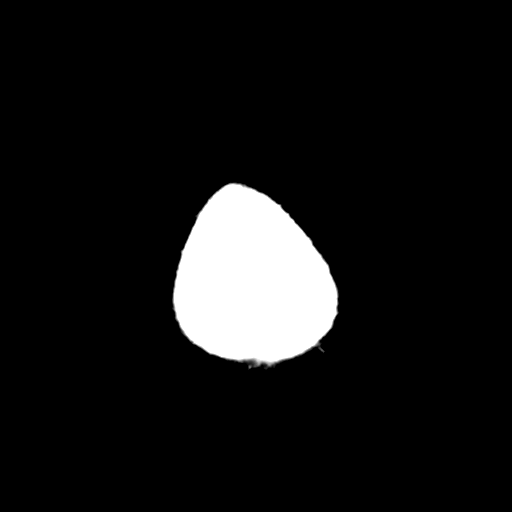

[Series 6: coronal soft · coronal · 0.33mm/px · 3 of 74 slices shown]
[im 19/74  brain]
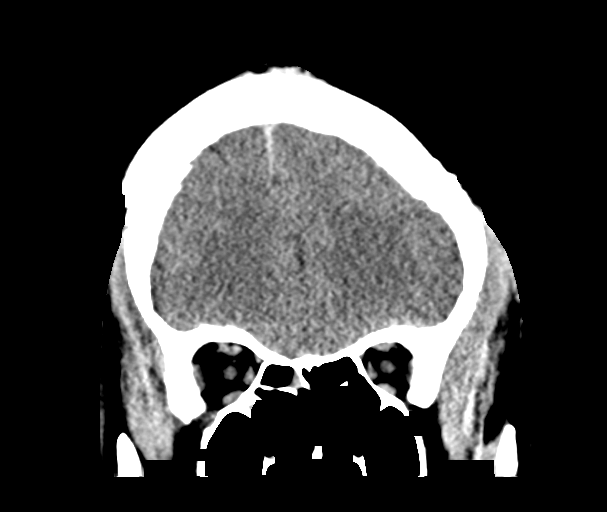
[im 37/74  brain]
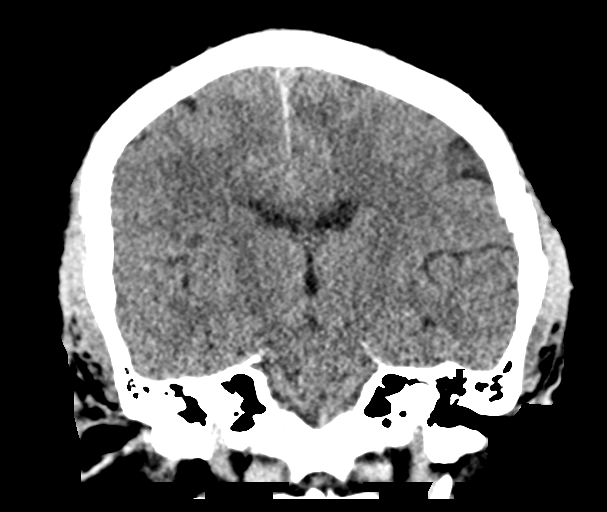
[im 55/74  brain]
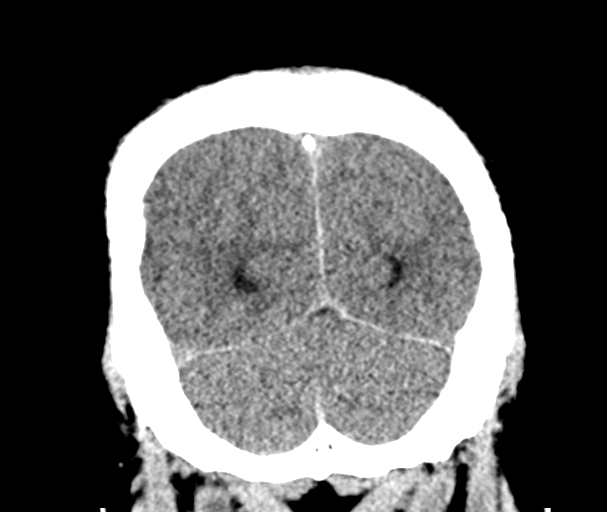

[Series 7: sag soft · sagittal · 0.33mm/px · 2 of 67 slices shown]
[im 23/67  brain]
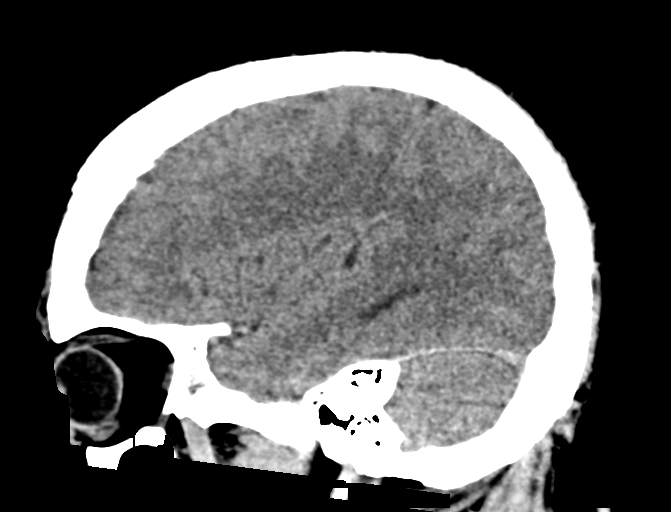
[im 45/67  brain]
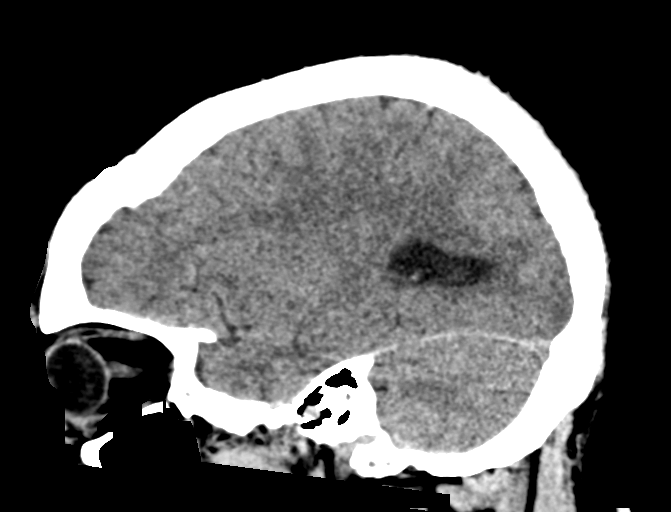

[16 of 47 positions shown; findings below may reference images not displayed]

FINDINGS: Brain: No evidence for acute hemorrhage, mass lesion, midline shift,
hydrocephalus or large infarct.

Vascular: No hyperdense vessel or unexpected calcification.

Skull: Normal. Negative for fracture or focal lesion.

Sinuses/Orbits: Mucosal thickening in the ethmoid air cells and
sphenoid sinuses. Opacification in the small frontal sinuses.

Other: None.
IMPRESSION: No acute intracranial abnormality.

Paranasal sinus disease.

## 2020-01-12 ENCOUNTER — Encounter (HOSPITAL_BASED_OUTPATIENT_CLINIC_OR_DEPARTMENT_OTHER): Payer: Self-pay | Admitting: Emergency Medicine

## 2020-01-12 ENCOUNTER — Emergency Department (HOSPITAL_BASED_OUTPATIENT_CLINIC_OR_DEPARTMENT_OTHER)
Admission: EM | Admit: 2020-01-12 | Discharge: 2020-01-12 | Disposition: A | Discharge status: Home / Self Care | Payer: BLUE CROSS/BLUE SHIELD | Attending: Emergency Medicine | Admitting: Emergency Medicine

## 2020-01-12 ENCOUNTER — Other Ambulatory Visit: Payer: Self-pay

## 2020-01-12 DIAGNOSIS — E119 Type 2 diabetes mellitus without complications: Secondary | ICD-10-CM | POA: Insufficient documentation

## 2020-01-12 DIAGNOSIS — R519 Headache, unspecified: Secondary | ICD-10-CM | POA: Diagnosis not present

## 2020-01-12 DIAGNOSIS — Z7984 Long term (current) use of oral hypoglycemic drugs: Secondary | ICD-10-CM | POA: Insufficient documentation

## 2020-01-12 DIAGNOSIS — I10 Essential (primary) hypertension: Secondary | ICD-10-CM | POA: Diagnosis not present

## 2020-01-12 HISTORY — DX: Male erectile dysfunction, unspecified: N52.9

## 2020-01-12 HISTORY — DX: Nonalcoholic steatohepatitis (NASH): K75.81

## 2020-01-12 HISTORY — DX: Gastro-esophageal reflux disease without esophagitis: K21.9

## 2020-01-12 HISTORY — DX: Essential (primary) hypertension: I10

## 2020-01-12 HISTORY — DX: Pure hypercholesterolemia, unspecified: E78.00

## 2020-01-12 HISTORY — DX: Other specified abnormal findings of blood chemistry: R79.89

## 2020-01-12 LAB — CBG MONITORING, ED: Glucose-Capillary: 188 mg/dL — ABNORMAL HIGH (ref 70–99)

## 2020-01-12 MED ORDER — IBUPROFEN 400 MG PO TABS
600.0000 mg | ORAL_TABLET | Freq: Once | ORAL | Status: AC
  Administered 2020-01-12: 600 mg via ORAL
  Filled 2020-01-12: qty 1

## 2020-01-12 NOTE — Discharge Instructions (Addendum)
Take Tylenol or ibuprofen as needed for headache. Make sure staying well-hydrated water. Follow-up with your primary care doctor for further evaluation of headache and management of diabetes.  Return to the emergency room with any new, worsening, concerning symptoms.

## 2020-01-12 NOTE — ED Triage Notes (Signed)
Headache x 2 days. L 2nd toe pain after kicking something a few days ago.

## 2020-01-12 NOTE — ED Provider Notes (Signed)
[facility] EMERGENCY DEPARTMENT Provider Note   CSN: [id number] Arrival date & time: 01/12/20  1418     History Chief Complaint  Patient presents with  . Headache  . Toe Pain    Craig Mclean is a 49 y.o. male presenting for evaluation of headache and skin discoloration of the toe.  Patient states for the past 2 days, he has been having intermittent headache.  It is present bilaterally on his temples, occasionally feels like a band.  He is not taking anything for it including Tylenol ibuprofen.  He was concerned that this was a sign that his blood sugars were off.  He does not check his blood sugars regularly.  He has been taking his diabetes medications as prescribed.  He denies vision changes, slurred speech, fevers, chills, cough, shortness of breath, nausea, vomiting, known pain, urinary symptoms, abnormal bowel movements.  He denies of taste or smell.  He denies sick contacts.  He denies dizziness or lightheadedness.  He has no associated photophobia or phonophobia.  No history of migraines or significant headaches.  Additionally, patient states several weeks ago he hit his left fourth toe on the wall.  Since then, he feels the top of his toe is slightly discolored.  He is not having any significant pain, but is concerned about possible poor circulation.  He denies injury or color change elsewhere.  Patient states he has a primary care doctor, left the practice and he is working to find a new doctor.  HPI     Past Medical History:  Diagnosis Date  . Diabetes mellitus without complication ([place])   . Elevated LFTs   . Erectile dysfunction   . GERD (gastroesophageal reflux disease)   . High cholesterol   . Hypertension   . NASH (nonalcoholic steatohepatitis)     There are no problems to display for this patient.   History reviewed. No pertinent surgical history.     No family history on file.  Social History   Tobacco Use  . Smoking status: Never Smoker    . Smokeless tobacco: Never Used  Substance Use Topics  . Alcohol use: No  . Drug use: No    Home Medications Prior to Admission medications   Medication Sig Start Date End Date Taking? Authorizing Provider  blood glucose meter kit and supplies KIT Dispense based on patient and insurance preference. Use up to four times daily as directed. (FOR ICD-9 250.00, 250.01). 01/15/18   Horton, Barbette Hair, MD  HYDROcodone-acetaminophen (NORCO/VICODIN) 5-325 MG tablet Take 1 tablet by mouth every 6 (six) hours as needed for severe pain. 05/06/18   Blanchie Dessert, MD  metFORMIN (GLUCOPHAGE) 500 MG tablet Take 2 tablets (1,000 mg total) by mouth 2 (two) times daily with a meal. 01/15/18 02/14/18  Horton, Barbette Hair, MD    Allergies    Patient has no known allergies.  Review of Systems   Review of Systems  Skin: Positive for color change.  Neurological: Positive for headaches.    Physical Exam Updated Vital Signs BP (!) 146/104 (BP Location: Right Arm)   Pulse 83   Temp 98.2 F (36.8 C) (Oral)   Resp 14   Ht 5' 11"  (1.803 m)   Wt 117.9 kg   SpO2 100%   BMI 36.26 kg/m   Physical Exam Vitals and nursing note reviewed.  Constitutional:      General: He is not in acute distress.    Appearance: He is well-developed.  Comments: Sitting comfortably in the chair no acute distress  HENT:     Head: Normocephalic and atraumatic.     Right Ear: Tympanic membrane, ear canal and external ear normal.     Left Ear: Tympanic membrane, ear canal and external ear normal.  Eyes:     Extraocular Movements: Extraocular movements intact.     Conjunctiva/sclera: Conjunctivae normal.     Pupils: Pupils are equal, round, and reactive to light.     Comments: EOMI and PERRLA.  No nystagmus.  Cardiovascular:     Rate and Rhythm: Normal rate and regular rhythm.     Pulses: Normal pulses.  Pulmonary:     Effort: Pulmonary effort is normal. No respiratory distress.     Breath sounds: Normal breath  sounds. No wheezing.  Abdominal:     General: There is no distension.     Palpations: Abdomen is soft. There is no mass.     Tenderness: There is no abdominal tenderness. There is no guarding or rebound.  Musculoskeletal:        General: Normal range of motion.     Cervical back: Normal range of motion and neck supple.     Comments: No tenderness palpation of the foot.  No swelling or warmth.  Full active range of motion of the toes without pain or difficulty.  Good distal sensation.  Good distal cap refill.  Pedal pulses 2+ bilaterally.  Dorsal aspect of all toes slightly darker, likely due to being rubbed in the shoes.  No abnormality noted on the fourth toe when compared to others.   Skin:    General: Skin is warm and dry.     Capillary Refill: Capillary refill takes less than 2 seconds.  Neurological:     General: No focal deficit present.     Mental Status: He is alert and oriented to person, place, and time.     GCS: GCS eye subscore is 4. GCS verbal subscore is 5. GCS motor subscore is 6.     Cranial Nerves: Cranial nerves are intact.     Sensory: Sensation is intact.     Motor: Motor function is intact.     Coordination: Coordination is intact.     ED Results / Procedures / Treatments   Labs (all labs ordered are listed, but only abnormal results are displayed) Labs Reviewed  CBG MONITORING, ED - Abnormal; Notable for the following components:      Result Value   Glucose-Capillary 188 (*)    All other components within normal limits    EKG None  Radiology No results found.  Procedures Procedures (including critical care time)  Medications Ordered in ED Medications  ibuprofen (ADVIL) tablet 600 mg (600 mg Oral Given 01/12/20 1717)    ED Course  I have reviewed the triage vital signs and the nursing notes.  Pertinent labs & imaging results that were available during my care of the patient were reviewed by me and considered in my medical decision making (see  chart for details).    MDM Rules/Calculators/A&P                      Patient presenting for evaluation of headache and skin discoloration of the toe.  Physical examination, he is neurologically intact.  Patient states he has not been sleeping well, consider this is because of headache.  His CBG is reassuring at 188, doubt DKA or HHS.  Low suspicion for intracranial injury, CVA, TIA,  ICH, SDH, SAH.  I do not believe head imaging is necessary at this time.  Patient is agreeable. Additionally, patient with toe discoloration.  He does not want x-rays, as he has no pain.  I gave reassurance that this appears to be chronic skin change discoloration, nothing acute.  On reassessment after ibuprofen, headache has completely resolved.  Patient is symptom-free at this time.  Discussed follow-up with primary care doctor for further management of diabetes.  At this time, patient appears safe for discharge.  Return precautions given.  Patient states he understands and agrees to plan.  Final Clinical Impression(s) / ED Diagnoses Final diagnoses:  Acute nonintractable headache, unspecified headache type    Rx / DC Orders ED Discharge Orders    None       Franchot Heidelberg, PA-C 01/12/20 2204    Quintella Reichert, MD 01/12/20 2358

## 2020-05-29 ENCOUNTER — Emergency Department (HOSPITAL_BASED_OUTPATIENT_CLINIC_OR_DEPARTMENT_OTHER)
Admission: EM | Admit: 2020-05-29 | Discharge: 2020-05-29 | Disposition: A | Discharge status: Home / Self Care | Payer: BLUE CROSS/BLUE SHIELD | Attending: Emergency Medicine | Admitting: Emergency Medicine

## 2020-05-29 ENCOUNTER — Encounter (HOSPITAL_BASED_OUTPATIENT_CLINIC_OR_DEPARTMENT_OTHER): Payer: Self-pay

## 2020-05-29 ENCOUNTER — Emergency Department (HOSPITAL_BASED_OUTPATIENT_CLINIC_OR_DEPARTMENT_OTHER): Payer: BLUE CROSS/BLUE SHIELD

## 2020-05-29 ENCOUNTER — Other Ambulatory Visit: Payer: Self-pay

## 2020-05-29 DIAGNOSIS — R0981 Nasal congestion: Secondary | ICD-10-CM | POA: Insufficient documentation

## 2020-05-29 DIAGNOSIS — I10 Essential (primary) hypertension: Secondary | ICD-10-CM | POA: Diagnosis not present

## 2020-05-29 DIAGNOSIS — E119 Type 2 diabetes mellitus without complications: Secondary | ICD-10-CM | POA: Diagnosis not present

## 2020-05-29 DIAGNOSIS — R0602 Shortness of breath: Secondary | ICD-10-CM | POA: Diagnosis present

## 2020-05-29 DIAGNOSIS — R0789 Other chest pain: Secondary | ICD-10-CM | POA: Insufficient documentation

## 2020-05-29 DIAGNOSIS — R079 Chest pain, unspecified: Secondary | ICD-10-CM

## 2020-05-29 DIAGNOSIS — Z20822 Contact with and (suspected) exposure to covid-19: Secondary | ICD-10-CM | POA: Insufficient documentation

## 2020-05-29 LAB — BASIC METABOLIC PANEL
Anion gap: 9 (ref 5–15)
BUN: 18 mg/dL (ref 6–20)
CO2: 27 mmol/L (ref 22–32)
Calcium: 8.9 mg/dL (ref 8.9–10.3)
Chloride: 104 mmol/L (ref 98–111)
Creatinine, Ser: 1.26 mg/dL — ABNORMAL HIGH (ref 0.61–1.24)
GFR calc Af Amer: >60 mL/min (ref >60)
GFR calc non Af Amer: >60 mL/min (ref >60)
Glucose, Bld: 149 mg/dL — ABNORMAL HIGH (ref 70–99)
Potassium: 4.5 mmol/L (ref 3.5–5.1)
Sodium: 140 mmol/L (ref 135–145)

## 2020-05-29 LAB — CBC WITH DIFFERENTIAL/PLATELET
Abs Immature Granulocytes: 0.01 10*3/uL (ref 0.00–0.07)
Basophils Absolute: 0 10*3/uL (ref 0.0–0.1)
Basophils Relative: 1 %
Eosinophils Absolute: 0.3 10*3/uL (ref 0.0–0.5)
Eosinophils Relative: 4 %
HCT: 41.9 % (ref 39.0–52.0)
Hemoglobin: 13.3 g/dL (ref 13.0–17.0)
Immature Granulocytes: 0 %
Lymphocytes Relative: 32 %
Lymphs Abs: 2 10*3/uL (ref 0.7–4.0)
MCH: 27.9 pg (ref 26.0–34.0)
MCHC: 31.7 g/dL (ref 30.0–36.0)
MCV: 88 fL (ref 80.0–100.0)
Monocytes Absolute: 0.4 10*3/uL (ref 0.1–1.0)
Monocytes Relative: 6 %
Neutro Abs: 3.6 10*3/uL (ref 1.7–7.7)
Neutrophils Relative %: 57 %
Platelets: 197 10*3/uL (ref 150–400)
RBC: 4.76 MIL/uL (ref 4.22–5.81)
RDW: 13 % (ref 11.5–15.5)
WBC: 6.3 10*3/uL (ref 4.0–10.5)
nRBC: 0 % (ref 0.0–0.2)

## 2020-05-29 LAB — D-DIMER, QUANTITATIVE: D-Dimer, Quant: 0.28 ug/mL-FEU (ref 0.00–0.50)

## 2020-05-29 LAB — SARS CORONAVIRUS 2 BY RT PCR (HOSPITAL ORDER, PERFORMED IN ~~LOC~~ HOSPITAL LAB): SARS Coronavirus 2: NEGATIVE

## 2020-05-29 LAB — TROPONIN I (HIGH SENSITIVITY)
Troponin I (High Sensitivity): 3 ng/L (ref <18)
Troponin I (High Sensitivity): 3 ng/L (ref <18)

## 2020-05-29 NOTE — Discharge Instructions (Signed)
Work-up today was reassuring.  As we discussed, you will need to follow-up with your primary care doctor.  Return the emergency department for any chest pain, difficulty breathing, numbness/weakness of your arms or legs, difficulty walking or any other worsening or concerning symptoms.

## 2020-05-29 NOTE — ED Notes (Signed)
PT AMBULATED AROUND DEPARTMENT, O2 SAT 98-99% NO SOB OR LABORED BREATHING NOTED. TOLERATED WELL.

## 2020-05-29 NOTE — ED Triage Notes (Signed)
Pt c/o "hard to breathe",  prod cough x 2-3 days-NAD-steady gait

## 2020-05-29 NOTE — ED Provider Notes (Signed)
[facility] EMERGENCY DEPARTMENT Provider Note   CSN: [id number] Arrival date & time: 05/29/20  1503     History Chief Complaint  Patient presents with  . Shortness of Breath    Craig Mclean is a 49 y.o. male who presents for evaluation of shortness of breath that has been ongoing for last few days.  He states it has been intermittent.  He states that it is mostly with exertion.  He has not really noticed having difficulty breathing if he is at rest.  He states it is not every time that he exerts himself but just occasionally.  He also reports occasionally, he has gotten some discomfort across his chest.  He states it is both on the left and the right side.  He states that this occurs intermittently and randomly.  He currently denies any chest pain.  He states that that when he does have that pain, it is worse with deep inspiration but is not worse with exertion.  He states he has had some congestion but has not really noticed any cough.  He has not noted any fever.  He states that he did not get vaccinated for Covid.  He does not smoke.  He denies any personal cardiac history.  No family history of MI before the age of 70.  He denies any testosterone or hormone use.  No recent travel, surgeries, admission, history of DVT or PE, new leg swelling.   The history is provided by the patient.       Past Medical History:  Diagnosis Date  . Diabetes mellitus without complication ([place])   . Elevated LFTs   . Erectile dysfunction   . GERD (gastroesophageal reflux disease)   . High cholesterol   . Hypertension   . NASH (nonalcoholic steatohepatitis)     There are no problems to display for this patient.   History reviewed. No pertinent surgical history.     No family history on file.  Social History   Tobacco Use  . Smoking status: Never Smoker  . Smokeless tobacco: Never Used  Substance Use Topics  . Alcohol use: No  . Drug use: No    Home Medications Prior to  Admission medications   Medication Sig Start Date End Date Taking? Authorizing Provider  blood glucose meter kit and supplies KIT Dispense based on patient and insurance preference. Use up to four times daily as directed. (FOR ICD-9 250.00, 250.01). 01/15/18   Horton, Barbette Hair, MD  HYDROcodone-acetaminophen (NORCO/VICODIN) 5-325 MG tablet Take 1 tablet by mouth every 6 (six) hours as needed for severe pain. 05/06/18   Blanchie Dessert, MD  metFORMIN (GLUCOPHAGE) 500 MG tablet Take 2 tablets (1,000 mg total) by mouth 2 (two) times daily with a meal. 01/15/18 02/14/18  Horton, Barbette Hair, MD    Allergies    Patient has no known allergies.  Review of Systems   Review of Systems  HENT: Positive for congestion.   Respiratory: Positive for shortness of breath. Negative for cough.   Cardiovascular: Positive for chest pain.  Gastrointestinal: Negative for abdominal pain, nausea and vomiting.  Genitourinary: Negative for dysuria and hematuria.  Neurological: Negative for headaches.  All other systems reviewed and are negative.   Physical Exam Updated Vital Signs BP 140/78 (BP Location: Right Arm)   Pulse 78   Temp 98.2 F (36.8 C) (Oral)   Resp 20   Ht _0  (1.778 m)   Wt 112 kg   SpO2 99%  BMI 35.43 kg/m   Physical Exam Vitals and nursing note reviewed.  Constitutional:      Appearance: Normal appearance. He is well-developed.  HENT:     Head: Normocephalic and atraumatic.  Eyes:     General: Lids are normal.     Conjunctiva/sclera: Conjunctivae normal.     Pupils: Pupils are equal, round, and reactive to light.     Comments: PERRL. EOMs intact. No nystagmus. No neglect.   Cardiovascular:     Rate and Rhythm: Normal rate and regular rhythm.     Pulses: Normal pulses.     Heart sounds: Normal heart sounds. No murmur. No friction rub. No gallop.   Pulmonary:     Effort: Pulmonary effort is normal.     Breath sounds: Normal breath sounds.     Comments: Lungs clear to  auscultation bilaterally.  Symmetric chest rise.  No wheezing, rales, rhonchi.  Able to speak in full sentences without any difficulty.  No evidence of respiratory distress. Abdominal:     Palpations: Abdomen is soft. Abdomen is not rigid.     Tenderness: There is no abdominal tenderness. There is no guarding.     Comments: Abdomen is soft, non-distended, non-tender. No rigidity, No guarding. No peritoneal signs.  Musculoskeletal:        General: Normal range of motion.     Cervical back: Full passive range of motion without pain.  Skin:    General: Skin is warm and dry.     Capillary Refill: Capillary refill takes less than 2 seconds.  Neurological:     Mental Status: He is alert and oriented to person, place, and time.     Comments: Cranial nerves III-XII intact Follows commands, Moves all extremities  5/5 strength to BUE and BLE  Sensation intact throughout all major nerve distributions No gait abnormalities  No slurred speech. No facial droop.   Psychiatric:        Speech: Speech normal.     ED Results / Procedures / Treatments   Labs (all labs ordered are listed, but only abnormal results are displayed) Labs Reviewed  BASIC METABOLIC PANEL - Abnormal; Notable for the following components:      Result Value   Glucose, Bld 149 (*)    Creatinine, Ser 1.26 (*)    All other components within normal limits  SARS CORONAVIRUS 2 BY RT PCR (HOSPITAL ORDER, [facility])  CBC WITH DIFFERENTIAL/PLATELET  D-DIMER, QUANTITATIVE (NOT AT [facility])  TROPONIN I (HIGH SENSITIVITY)  TROPONIN I (HIGH SENSITIVITY)    EKG EKG Interpretation  Date/Time:  Friday May 29 2020 15:03:57 EDT Ventricular Rate:  79 PR Interval:  158 QRS Duration: 90 QT Interval:  356 QTC Calculation: 408 R Axis:   21 Text Interpretation: Normal sinus rhythm Nonspecific T wave abnormality Abnormal ECG No significant change since last tracing Confirmed by Theotis Burrow [phone]) on 05/29/2020  4:26:21 PM   Radiology DG Chest Portable 1 View  Result Date: 05/29/2020 CLINICAL DATA:  Cough and shortness of breath EXAM: PORTABLE CHEST 1 VIEW COMPARISON:  May 31, 2018 FINDINGS: The lungs are clear. The heart size and pulmonary vascularity are normal. No adenopathy. No bone lesions. IMPRESSION: Lungs clear.  Cardiac silhouette normal. Electronically Signed   By: Lowella Grip III M.D.   On: 05/29/2020 16:04    Procedures Procedures (including critical care time)  Medications Ordered in ED Medications - No data to display  ED Course  I have reviewed  the triage vital signs and the nursing notes.  Pertinent labs & imaging results that were available during my care of the patient were reviewed by me and considered in my medical decision making (see chart for details).    MDM Rules/Calculators/A&P                      49 year old male who presents for evaluation of shortness of breath that has been ongoing for last few days.  He states it is intermittent but mainly notices it with exertion.  He also has had some congestion.  He also reports occasionally, he will get some discomfort in his anterior chest.  This is not associated with exertion.  He does report some worsening of the sensation with deep inspiration.  He did not get Covid vaccines.  No fevers, new leg swelling.  On initially arrival, he is afebrile nontoxic-appearing.  Vital signs are stable.  Lungs clear to auscultation.  No evidence of respiratory distress.  Low suspicion for ACS etiology but a consideration.  History/physical exam not concerning for fluid overload/heart failure.  Low suspicion for PE but he does report exertional dyspnea on exertion as well as pleuritic chest pain.  He has no PE risk factors but will plan for D-dimer since he is low risk.  Additionally, consider viral etiology such as COVID-19 infection.  Plan for labs, chest x-ray, EKG.  Initial troponin negative.  D-dimer is negative.  Covid test  negative.  BMP shows normal BUN.  Creatinine is 1.26.  CBC shows no leukocytosis or anemia.  Chest x-ray negative for any infectious etiology.  Delta troponin negative.  At this time, his story is not concerning for ACS etiology.  Given reassuring work-up, 2 - troponins, feel that this is not ACS etiology that is contributing to his symptoms.  Ambulated while maintaining O2 sats of 98-99%.  I discussed results with patient.  He is in no signs of distress.  He is hemodynamically stable without any signs of hypoxia or tachycardia.  He does report that he felt slightly lightheaded when he walked.  I evaluated patient and he showed no signs of gait ataxia.  No neuro deficits.  Exam not concerning for CVA.  I discussed with patient that he should follow-up with his primary care doctor.  Overall unclear etiology, patient appears appropriate for outpatient work-up with primary care.  We will plan for outpatient Covid test given symptoms. At this time, patient exhibits no emergent life-threatening condition that require further evaluation in ED or admission. Patient had ample opportunity for questions and discussion. All patient's questions were answered with full understanding. Strict return precautions discussed. Patient expresses understanding and agreement to plan.   Craig Mclean was evaluated in Emergency Department on 05/29/2020 for the symptoms described in the history of present illness. He was evaluated in the context of the global COVID-19 pandemic, which necessitated consideration that the patient might be at risk for infection with the SARS-CoV-2 virus that causes COVID-19. Institutional protocols and algorithms that pertain to the evaluation of patients at risk for COVID-19 are in a state of rapid change based on information released by regulatory bodies including the CDC and federal and state organizations. These policies and algorithms were followed during the patient's care in the ED  Portions of  this note were generated with Dragon dictation software. Dictation errors may occur despite best attempts at proofreading.   Final Clinical Impression(s) / ED Diagnoses Final diagnoses:  SOB (shortness of breath)  Chest pain, unspecified type    Rx / DC Orders ED Discharge Orders    None       Desma Mcgregor 05/29/20 2346    Little, Wenda Overland, MD 05/29/20 2355

## 2020-10-06 ENCOUNTER — Emergency Department (HOSPITAL_BASED_OUTPATIENT_CLINIC_OR_DEPARTMENT_OTHER)
Admission: EM | Admit: 2020-10-06 | Discharge: 2020-10-06 | Disposition: A | Discharge status: Home / Self Care | Payer: 59 | Attending: Emergency Medicine | Admitting: Emergency Medicine

## 2020-10-06 ENCOUNTER — Other Ambulatory Visit: Payer: Self-pay

## 2020-10-06 ENCOUNTER — Emergency Department (HOSPITAL_BASED_OUTPATIENT_CLINIC_OR_DEPARTMENT_OTHER): Payer: 59

## 2020-10-06 ENCOUNTER — Encounter (HOSPITAL_BASED_OUTPATIENT_CLINIC_OR_DEPARTMENT_OTHER): Payer: Self-pay | Admitting: *Deleted

## 2020-10-06 DIAGNOSIS — I10 Essential (primary) hypertension: Secondary | ICD-10-CM | POA: Insufficient documentation

## 2020-10-06 DIAGNOSIS — E119 Type 2 diabetes mellitus without complications: Secondary | ICD-10-CM | POA: Insufficient documentation

## 2020-10-06 DIAGNOSIS — R42 Dizziness and giddiness: Secondary | ICD-10-CM | POA: Insufficient documentation

## 2020-10-06 DIAGNOSIS — R079 Chest pain, unspecified: Secondary | ICD-10-CM | POA: Insufficient documentation

## 2020-10-06 LAB — CBC WITH DIFFERENTIAL/PLATELET
Abs Immature Granulocytes: 0.01 10*3/uL (ref 0.00–0.07)
Basophils Absolute: 0.1 10*3/uL (ref 0.0–0.1)
Basophils Relative: 1 %
Eosinophils Absolute: 0.3 10*3/uL (ref 0.0–0.5)
Eosinophils Relative: 5 %
HCT: 44.7 % (ref 39.0–52.0)
Hemoglobin: 14.1 g/dL (ref 13.0–17.0)
Immature Granulocytes: 0 %
Lymphocytes Relative: 38 %
Lymphs Abs: 2.3 10*3/uL (ref 0.7–4.0)
MCH: 28.1 pg (ref 26.0–34.0)
MCHC: 31.5 g/dL (ref 30.0–36.0)
MCV: 89 fL (ref 80.0–100.0)
Monocytes Absolute: 0.4 10*3/uL (ref 0.1–1.0)
Monocytes Relative: 6 %
Neutro Abs: 2.9 10*3/uL (ref 1.7–7.7)
Neutrophils Relative %: 50 %
Platelets: 189 10*3/uL (ref 150–400)
RBC: 5.02 MIL/uL (ref 4.22–5.81)
RDW: 12.9 % (ref 11.5–15.5)
WBC: 5.9 10*3/uL (ref 4.0–10.5)
nRBC: 0 % (ref 0.0–0.2)

## 2020-10-06 LAB — COMPREHENSIVE METABOLIC PANEL
ALT: 42 U/L (ref 0–44)
AST: 28 U/L (ref 15–41)
Albumin: 4.1 g/dL (ref 3.5–5.0)
Alkaline Phosphatase: 142 U/L — ABNORMAL HIGH (ref 38–126)
Anion gap: 10 (ref 5–15)
BUN: 17 mg/dL (ref 6–20)
CO2: 25 mmol/L (ref 22–32)
Calcium: 9.1 mg/dL (ref 8.9–10.3)
Chloride: 102 mmol/L (ref 98–111)
Creatinine, Ser: 1.06 mg/dL (ref 0.61–1.24)
GFR, Estimated: >60 mL/min (ref >60)
Glucose, Bld: 123 mg/dL — ABNORMAL HIGH (ref 70–99)
Potassium: 3.9 mmol/L (ref 3.5–5.1)
Sodium: 137 mmol/L (ref 135–145)
Total Bilirubin: 0.9 mg/dL (ref 0.3–1.2)
Total Protein: 8.1 g/dL (ref 6.5–8.1)

## 2020-10-06 LAB — TROPONIN I (HIGH SENSITIVITY)
Troponin I (High Sensitivity): 3 ng/L (ref <18)
Troponin I (High Sensitivity): 2 ng/L (ref <18)

## 2020-10-06 NOTE — ED Triage Notes (Signed)
C/o left sided chest pain and dizziness x 1 week

## 2020-10-06 NOTE — ED Provider Notes (Signed)
[facility] EMERGENCY DEPARTMENT Provider Note   CSN: [id number] Arrival date & time: 10/06/20  1302     History Chief Complaint  Patient presents with  . Chest Pain    Razi Hickle is a 49 y.o. male.  The history is provided by the patient.  Chest Pain Pain location:  L chest Pain quality: aching   Pain radiates to:  Does not radiate Pain severity:  Mild Onset quality:  Gradual Timing:  Intermittent Progression:  Waxing and waning Chronicity:  New Relieved by:  Nothing Worsened by:  Nothing Associated symptoms: no abdominal pain, no back pain, no cough, no fever, no palpitations, no shortness of breath and no vomiting   Risk factors: diabetes mellitus, high cholesterol and hypertension   Risk factors: no coronary artery disease        Past Medical History:  Diagnosis Date  . Diabetes mellitus without complication ([place])   . Elevated LFTs   . Erectile dysfunction   . GERD (gastroesophageal reflux disease)   . High cholesterol   . Hypertension   . NASH (nonalcoholic steatohepatitis)     There are no problems to display for this patient.   No past surgical history on file.     No family history on file.  Social History   Tobacco Use  . Smoking status: Never Smoker  . Smokeless tobacco: Never Used  Vaping Use  . Vaping Use: Never used  Substance Use Topics  . Alcohol use: No  . Drug use: No    Home Medications Prior to Admission medications   Medication Sig Start Date End Date Taking? Authorizing Provider  blood glucose meter kit and supplies KIT Dispense based on patient and insurance preference. Use up to four times daily as directed. (FOR ICD-9 250.00, 250.01). 01/15/18   Horton, Barbette Hair, MD  HYDROcodone-acetaminophen (NORCO/VICODIN) 5-325 MG tablet Take 1 tablet by mouth every 6 (six) hours as needed for severe pain. 05/06/18   Blanchie Dessert, MD  metFORMIN (GLUCOPHAGE) 500 MG tablet Take 2 tablets (1,000 mg total) by mouth 2  (two) times daily with a meal. 01/15/18 02/14/18  Horton, Barbette Hair, MD    Allergies    Patient has no known allergies.  Review of Systems   Review of Systems  Constitutional: Negative for chills and fever.  HENT: Negative for ear pain and sore throat.   Eyes: Negative for pain and visual disturbance.  Respiratory: Negative for cough and shortness of breath.   Cardiovascular: Positive for chest pain. Negative for palpitations.  Gastrointestinal: Negative for abdominal pain and vomiting.  Genitourinary: Negative for dysuria and hematuria.  Musculoskeletal: Negative for arthralgias and back pain.  Skin: Negative for color change and rash.  Neurological: Positive for light-headedness. Negative for seizures and syncope.  All other systems reviewed and are negative.   Physical Exam Updated Vital Signs  ED Triage Vitals  Enc Vitals Group     BP 10/06/20 1308 110/80     Pulse Rate 10/06/20 1308 72     Resp 10/06/20 1308 16     Temp 10/06/20 1308 98.2 F (36.8 C)     Temp src --      SpO2 10/06/20 1308 100 %     Weight 10/06/20 1306 245 lb (111.1 kg)     Height 10/06/20 1306 _0  (1.803 m)     Head Circumference --      Peak Flow --      Pain Score 10/06/20 1306 2  Pain Loc --      Pain Edu? --      Excl. in [place]? --     Physical Exam Vitals and nursing note reviewed.  Constitutional:      General: He is not in acute distress.    Appearance: He is well-developed. He is not ill-appearing.  HENT:     Head: Normocephalic and atraumatic.  Eyes:     Conjunctiva/sclera: Conjunctivae normal.     Pupils: Pupils are equal, round, and reactive to light.  Cardiovascular:     Rate and Rhythm: Normal rate and regular rhythm.     Pulses:          Radial pulses are 2+ on the right side and 2+ on the left side.       Dorsalis pedis pulses are 2+ on the right side and 2+ on the left side.     Heart sounds: No murmur heard.   Pulmonary:     Effort: Pulmonary effort is normal. No  respiratory distress.     Breath sounds: Normal breath sounds.  Abdominal:     Palpations: Abdomen is soft.     Tenderness: There is no abdominal tenderness.  Musculoskeletal:     Cervical back: Normal range of motion and neck supple.     Right lower leg: No edema.     Left lower leg: No edema.  Skin:    General: Skin is warm and dry.     Capillary Refill: Capillary refill takes less than 2 seconds.  Neurological:     General: No focal deficit present.     Mental Status: He is alert and oriented to person, place, and time.     Cranial Nerves: No cranial nerve deficit.     Motor: No weakness.     Comments: 5+ out of 5 strength throughout, normal sensation, no drift, normal finger-nose-finger     ED Results / Procedures / Treatments   Labs (all labs ordered are listed, but only abnormal results are displayed) Labs Reviewed  COMPREHENSIVE METABOLIC PANEL - Abnormal; Notable for the following components:      Result Value   Glucose, Bld 123 (*)    Alkaline Phosphatase 142 (*)    All other components within normal limits  CBC WITH DIFFERENTIAL/PLATELET  TROPONIN I (HIGH SENSITIVITY)  TROPONIN I (HIGH SENSITIVITY)    EKG EKG Interpretation  Date/Time:  Tuesday October 06 2020 13:05:20 EDT Ventricular Rate:  73 PR Interval:  152 QRS Duration: 86 QT Interval:  374 QTC Calculation: 412 R Axis:   48 Text Interpretation: Normal sinus rhythm Nonspecific T wave abnormality Abnormal ECG No significant change since last tracing Confirmed by Quintella Reichert [phone]) on 10/06/2020 1:11:09 PM Also confirmed by Lennice Sites [phone])  on 10/06/2020 3:39:19 PM   Radiology DG Chest 2 View  Result Date: 10/06/2020 CLINICAL DATA:  Chest pain and dizziness. EXAM: CHEST - 2 VIEW COMPARISON:  05/29/2020 FINDINGS: The heart size and mediastinal contours are within normal limits. Both lungs are clear. No pleural effusions or pneumothorax. The visualized skeletal structures are unremarkable.  IMPRESSION: No active cardiopulmonary disease. Electronically Signed   By: Margaretha Sheffield MD   On: 10/06/2020 13:46    Procedures Procedures (including critical care time)  Medications Ordered in ED Medications - No data to display  ED Course  I have reviewed the triage vital signs and the nursing notes.  Pertinent labs & imaging results that were available during my care of the patient  were reviewed by me and considered in my medical decision making (see chart for details).    MDM Rules/Calculators/A&P                          Ismeal Heider is a 49 year old male with history of high cholesterol, diabetes, hypertension who presents the ED with chest pain, lightheadedness.  Normal vitals.  No fever.  Overall no symptoms now.  Had some nonspecific chest pain this past week.  But was mostly concerned about some episodes of lightheadedness today.  Started lisinopril yesterday.  Blood pressure lower than normal but blood pressure is in a normal range now.  Neurologically patient is intact.  No concern for stroke.  No concern for inner ear or vertigo.  Overall he appears well.  Lab work showed no significant anemia, electrolyte abnormality, kidney injury.  Chest x-ray without signs of infection.  Troponin negative x2.  EKG shows no ischemic changes.  Unchanged from prior.  Likely mild side effects from lisinopril.  Given return precautions and discharged in the ED in good condition.  This chart was dictated using voice recognition software.  Despite best efforts to proofread,  errors can occur which can change the documentation meaning.    Final Clinical Impression(s) / ED Diagnoses Final diagnoses:  Lightheadedness  Nonspecific chest pain    Rx / DC Orders ED Discharge Orders    None       Lennice Sites, DO 10/06/20 1639

## 2020-10-06 NOTE — Discharge Instructions (Addendum)
Lab work today was unremarkable. Cardiac work-up is normal. Suspect symptoms are secondary to new blood pressure medication. Follow-up with your primary care doctor. Return to the emergency department symptoms worsen.

## 2020-11-11 ENCOUNTER — Other Ambulatory Visit: Payer: Self-pay

## 2020-11-11 ENCOUNTER — Emergency Department (HOSPITAL_BASED_OUTPATIENT_CLINIC_OR_DEPARTMENT_OTHER)
Admission: EM | Admit: 2020-11-11 | Discharge: 2020-11-11 | Disposition: A | Discharge status: Home / Self Care | Payer: 59 | Attending: Emergency Medicine | Admitting: Emergency Medicine

## 2020-11-11 ENCOUNTER — Encounter (HOSPITAL_BASED_OUTPATIENT_CLINIC_OR_DEPARTMENT_OTHER): Payer: Self-pay | Admitting: *Deleted

## 2020-11-11 DIAGNOSIS — R509 Fever, unspecified: Secondary | ICD-10-CM

## 2020-11-11 DIAGNOSIS — K0889 Other specified disorders of teeth and supporting structures: Secondary | ICD-10-CM | POA: Diagnosis not present

## 2020-11-11 DIAGNOSIS — Z794 Long term (current) use of insulin: Secondary | ICD-10-CM | POA: Diagnosis not present

## 2020-11-11 DIAGNOSIS — Z20822 Contact with and (suspected) exposure to covid-19: Secondary | ICD-10-CM | POA: Insufficient documentation

## 2020-11-11 DIAGNOSIS — R5081 Fever presenting with conditions classified elsewhere: Secondary | ICD-10-CM | POA: Diagnosis not present

## 2020-11-11 DIAGNOSIS — E119 Type 2 diabetes mellitus without complications: Secondary | ICD-10-CM | POA: Insufficient documentation

## 2020-11-11 DIAGNOSIS — I10 Essential (primary) hypertension: Secondary | ICD-10-CM | POA: Diagnosis not present

## 2020-11-11 LAB — RESP PANEL BY RT PCR (RSV, FLU A&B, COVID)
Influenza A by PCR: NEGATIVE
Influenza B by PCR: NEGATIVE
Respiratory Syncytial Virus by PCR: NEGATIVE
SARS Coronavirus 2 by RT PCR: NEGATIVE

## 2020-11-11 LAB — CBG MONITORING, ED: Glucose-Capillary: 113 mg/dL — ABNORMAL HIGH (ref 70–99)

## 2020-11-11 MED ORDER — ACETAMINOPHEN 325 MG PO TABS
650.0000 mg | ORAL_TABLET | Freq: Once | ORAL | Status: AC
  Administered 2020-11-11: 650 mg via ORAL
  Filled 2020-11-11: qty 2

## 2020-11-11 MED ORDER — PENICILLIN V POTASSIUM 250 MG PO TABS
500.0000 mg | ORAL_TABLET | Freq: Once | ORAL | Status: AC
  Administered 2020-11-11: 500 mg via ORAL
  Filled 2020-11-11: qty 2

## 2020-11-11 MED ORDER — PENICILLIN V POTASSIUM 500 MG PO TABS: 500.0000 mg | ORAL_TABLET | Freq: Four times a day (QID) | ORAL | 0 refills | Status: AC

## 2020-11-11 NOTE — ED Provider Notes (Signed)
[facility] EMERGENCY DEPARTMENT Provider Note   CSN: [id number] Arrival date & time: 11/11/20  1649     History Chief Complaint  Patient presents with   Fever    Craig Mclean is a 49 y.o. male.  HPI   49 year old male with history of diabetes, elevated LFTs, GERD, Karlene Lineman, hyperlipidemia, hypertension, who presents the emergency department today for evaluation of fevers.  States fever started 2 days ago.  He had some associated lightheadedness and headache that started yesterday as well.  Those symptoms have largely resolved.  He is also complaining of some pain to the left upper mouth.  This is all been ongoing for some time.  He has had multiple rounds of antibiotics for this and states he just finished the antibiotics for this few days ago.  He does not have money to follow-up with a dentist at this time.  He denies any recent rhinorrhea, nasal congestion, sore throat, chest pain, shortness of breath, cough, abdominal pain, nausea, vomiting, diarrhea, dysuria, frequency urgency.  He denies any known Covid exposures.  Past Medical History:  Diagnosis Date   Diabetes mellitus without complication (HCC)    Elevated LFTs    Erectile dysfunction    GERD (gastroesophageal reflux disease)    High cholesterol    Hypertension    NASH (nonalcoholic steatohepatitis)     There are no problems to display for this patient.   History reviewed. No pertinent surgical history.     No family history on file.  Social History   Tobacco Use   Smoking status: Never Smoker   Smokeless tobacco: Never Used  Vaping Use   Vaping Use: Never used  Substance Use Topics   Alcohol use: No   Drug use: No    Home Medications Prior to Admission medications   Medication Sig Start Date End Date Taking? Authorizing Provider  blood glucose meter kit and supplies KIT Dispense based on patient and insurance preference. Use up to four times daily as directed. (FOR ICD-9 250.00,  250.01). 01/15/18   Horton, Barbette Hair, MD  HYDROcodone-acetaminophen (NORCO/VICODIN) 5-325 MG tablet Take 1 tablet by mouth every 6 (six) hours as needed for severe pain. 05/06/18   Blanchie Dessert, MD  metFORMIN (GLUCOPHAGE) 500 MG tablet Take 2 tablets (1,000 mg total) by mouth 2 (two) times daily with a meal. 01/15/18 02/14/18  Horton, Barbette Hair, MD  penicillin v potassium (VEETID) 500 MG tablet Take 1 tablet (500 mg total) by mouth 4 (four) times daily for 7 days. 11/11/20 11/18/20  Aitanna Haubner S, PA-C    Allergies    Patient has no known allergies.  Review of Systems   Review of Systems  Constitutional: Positive for fever.  HENT: Positive for dental problem. Negative for ear pain and sore throat.   Eyes: Negative for visual disturbance.  Respiratory: Negative for cough and shortness of breath.   Cardiovascular: Negative for chest pain.  Gastrointestinal: Negative for abdominal pain, constipation, diarrhea, nausea and vomiting.  Genitourinary: Negative for dysuria and hematuria.  Musculoskeletal: Negative for back pain.  Skin: Negative for rash.  Neurological: Positive for light-headedness and headaches. Negative for weakness and numbness.  All other systems reviewed and are negative.   Physical Exam Updated Vital Signs BP 134/81    Pulse 97    Temp (!) 102 F (38.9 C) (Oral)    Resp 16    Ht _0  (1.778 m)    Wt 108.9 kg    SpO2 98%  BMI 34.44 kg/m   Physical Exam Vitals and nursing note reviewed.  Constitutional:      Appearance: He is well-developed.  HENT:     Head: Normocephalic and atraumatic.     Mouth/Throat:     Comments: Multiple dental caries and missing teeth.  TTP noted to the left upper aspect of the mouth where there are multiple missing teeth.  There is no periapical abscess.  No sublingual or submandibular swelling.  No trismus.  No neck  swelling. Eyes:     Conjunctiva/sclera: Conjunctivae normal.  Cardiovascular:     Rate and Rhythm: Normal rate  and regular rhythm.     Heart sounds: Normal heart sounds. No murmur heard.   Pulmonary:     Effort: Pulmonary effort is normal. No respiratory distress.     Breath sounds: Normal breath sounds. No wheezing, rhonchi or rales.  Abdominal:     General: Bowel sounds are normal.     Palpations: Abdomen is soft.     Tenderness: There is no abdominal tenderness. There is no guarding or rebound.  Musculoskeletal:     Cervical back: Neck supple.  Skin:    General: Skin is warm and dry.  Neurological:     Mental Status: He is alert.     Comments: Mental Status:  Alert, thought content appropriate, able to give a coherent history. Speech fluent without evidence of aphasia. Able to follow 2 step commands without difficulty.  Cranial Nerves:  II: pupils equal, round, reactive to light III,IV, VI: ptosis not present, extra-ocular motions intact bilaterally  V,VII: smile symmetric, facial light touch sensation equal VIII: hearing grossly normal to voice  X: uvula elevates symmetrically  XI: bilateral shoulder shrug symmetric and strong XII: midline tongue extension without fassiculations Motor:  Normal tone. 5/5 strength of BUE and BLE major muscle groups including strong and equal grip strength and dorsiflexion/plantar flexion Sensory: light touch normal in all extremities.     ED Results / Procedures / Treatments   Labs (all labs ordered are listed, but only abnormal results are displayed) Labs Reviewed  CBG MONITORING, ED - Abnormal; Notable for the following components:      Result Value   Glucose-Capillary 113 (*)    All other components within normal limits  RESP PANEL BY RT PCR (RSV, FLU A&B, COVID)    EKG None  Radiology No results found.  Procedures Procedures (including critical care time)  Medications Ordered in ED Medications  acetaminophen (TYLENOL) tablet 650 mg (650 mg Oral Given 11/11/20 1839)    ED Course  I have reviewed the triage vital signs and the  nursing notes.  Pertinent labs & imaging results that were available during my care of the patient were reviewed by me and considered in my medical decision making (see chart for details).    MDM Rules/Calculators/A&P                          49 year old male presenting the emergency department today for evaluation of dental pain.  Also with fevers that started yesterday associated with lightheadedness and headache.  Headache and lightheadedness resolved at this time.  Neuro exam within normal limits.  Will test for Covid.  Will also treat dental pain with antibiotics. He does not appear to have any evidence of a deep space infection at this time.  Covid test is negative.  Reassessed patient.  He feels some improvement after Tylenol here in the ED.  We discussed negative Covid test.  Offered to obtain further work-up including labs, chest x-ray, UA versus close monitoring of symptoms and recheck in 48 hours.  He prefers to be discharged with some antibiotics for his dental pain, Tylenol, Motrin and have close follow-up.  I did advise that if he had any new or worsening symptoms in the meantime he should return to emergency department immediately.  This is reasonable, patient is nontoxic-appearing on exam and does not have any specific infectious symptoms at this time other than that his dental pain.  Final Clinical Impression(s) / ED Diagnoses Final diagnoses:  Pain, dental    Rx / DC Orders ED Discharge Orders         Ordered    penicillin v potassium (VEETID) 500 MG tablet  4 times daily        11/11/20 1933           Rodney Booze, PA-C 11/11/20 1947    Lucrezia Starch, MD 11/12/20 1620

## 2020-11-11 NOTE — ED Triage Notes (Signed)
Pt c/o mouth pain , fever chills x 2 weeks

## 2020-11-11 NOTE — ED Notes (Signed)
Given po fluids

## 2020-11-11 NOTE — Discharge Instructions (Addendum)
You were given a prescription for antibiotics. Please take the antibiotic prescription fully.   Please follow-up with a dentist in the next 5 to 7 days for reevaluation.  If you do not have a dentist, resources were provided for dentist in the area in your discharge summary.  Please contact one of the offices that are listed and make an appointment for follow-up.  Please return to the emergency department for any new or worsening symptoms.

## 2021-03-15 ENCOUNTER — Other Ambulatory Visit: Payer: Self-pay

## 2021-03-15 ENCOUNTER — Emergency Department (HOSPITAL_BASED_OUTPATIENT_CLINIC_OR_DEPARTMENT_OTHER)
Admission: EM | Admit: 2021-03-15 | Discharge: 2021-03-15 | Disposition: A | Discharge status: Home / Self Care | Payer: BLUE CROSS/BLUE SHIELD | Attending: Emergency Medicine | Admitting: Emergency Medicine

## 2021-03-15 ENCOUNTER — Emergency Department (HOSPITAL_BASED_OUTPATIENT_CLINIC_OR_DEPARTMENT_OTHER): Payer: BLUE CROSS/BLUE SHIELD

## 2021-03-15 ENCOUNTER — Encounter (HOSPITAL_BASED_OUTPATIENT_CLINIC_OR_DEPARTMENT_OTHER): Payer: Self-pay | Admitting: *Deleted

## 2021-03-15 DIAGNOSIS — E119 Type 2 diabetes mellitus without complications: Secondary | ICD-10-CM | POA: Insufficient documentation

## 2021-03-15 DIAGNOSIS — M1712 Unilateral primary osteoarthritis, left knee: Secondary | ICD-10-CM

## 2021-03-15 DIAGNOSIS — M1990 Unspecified osteoarthritis, unspecified site: Secondary | ICD-10-CM | POA: Diagnosis not present

## 2021-03-15 DIAGNOSIS — I10 Essential (primary) hypertension: Secondary | ICD-10-CM | POA: Insufficient documentation

## 2021-03-15 DIAGNOSIS — M25462 Effusion, left knee: Secondary | ICD-10-CM

## 2021-03-15 DIAGNOSIS — M25562 Pain in left knee: Secondary | ICD-10-CM | POA: Diagnosis present

## 2021-03-15 DIAGNOSIS — Z79899 Other long term (current) drug therapy: Secondary | ICD-10-CM | POA: Diagnosis not present

## 2021-03-15 DIAGNOSIS — Z7984 Long term (current) use of oral hypoglycemic drugs: Secondary | ICD-10-CM | POA: Insufficient documentation

## 2021-03-15 MED ORDER — MELOXICAM 7.5 MG PO TABS: 7.5000 mg | ORAL_TABLET | Freq: Every day | ORAL | 0 refills | Status: DC

## 2021-03-15 MED ORDER — NAPROXEN 250 MG PO TABS
375.0000 mg | ORAL_TABLET | Freq: Once | ORAL | Status: AC
  Administered 2021-03-15: 375 mg via ORAL
  Filled 2021-03-15: qty 2

## 2021-03-15 NOTE — ED Triage Notes (Signed)
Left knee pain since yesterday. He has been taking Ibuprofen.

## 2021-03-15 NOTE — ED Provider Notes (Signed)
[facility] EMERGENCY DEPARTMENT Provider Note   CSN: [id number] Arrival date & time: 03/15/21  1458     History No chief complaint on file.   Craig Mclean is a 50 y.o. male.  50 year old male presents with complaint of left knee pain onset 2 days ago.  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 Pain is worse/tight with flexion of the knee.  Patient took Motrin with improvement for 1 day, no relief today which prompted him to come to the ER. No other complaints or concerns.         Past Medical History:  Diagnosis Date  . Diabetes mellitus without complication ([place])   . Elevated LFTs   . Erectile dysfunction   . GERD (gastroesophageal reflux disease)   . High cholesterol   . Hypertension   . NASH (nonalcoholic steatohepatitis)     There are no problems to display for this patient.   History reviewed. No pertinent surgical history.     No family history on file.  Social History   Tobacco Use  . Smoking status: Never Smoker  . Smokeless tobacco: Never Used  Vaping Use  . Vaping Use: Never used  Substance Use Topics  . Alcohol use: No  . Drug use: No    Home Medications Prior to Admission medications   Medication Sig Start Date End Date Taking? Authorizing Provider  amLODipine (NORVASC) 10 MG tablet Take by mouth. 10/02/20  Yes [provider]  atorvastatin (LIPITOR) 80 MG tablet Take by mouth. 10/02/20  Yes [provider]  blood glucose meter kit and supplies KIT Dispense based on patient and insurance preference. Use up to four times daily as directed. (FOR ICD-9 250.00, 250.01). 01/15/18  Yes Horton, Barbette Hair, MD  Empagliflozin-metFORMIN HCl ER (SYNJARDY XR) 12.04-999 MG TB24 Take 2 tablets po qAM 05/19/20  Yes [provider]  gabapentin (NEURONTIN) 100 MG capsule Take by mouth. 02/18/20  Yes [provider]  glipiZIDE (GLUCOTROL) 5 MG tablet Take by mouth. 05/19/20  Yes [provider]  lisinopril-hydrochlorothiazide (ZESTORETIC) 20-25 MG tablet Take 1 tablet by mouth daily. 10/02/20  Yes [provider]  meloxicam (MOBIC) 7.5 MG tablet Take 1 tablet (7.5 mg total) by mouth daily for 10 days. 03/15/21 03/25/21 Yes Tacy Learn, PA-C  Semaglutide,0.25 or 0.5MG/DOS, (OZEMPIC, 0.25 OR 0.5 MG/DOSE,) 2 MG/1.5ML SOPN Inject 0.5 mg into the skin once a week. 12/07/20  Yes [provider]  HYDROcodone-acetaminophen (NORCO/VICODIN) 5-325 MG tablet Take 1 tablet by mouth every 6 (six) hours as needed for severe pain. 05/06/18   Blanchie Dessert, MD  metFORMIN (GLUCOPHAGE) 500 MG tablet Take 2 tablets (1,000 mg total) by mouth 2 (two) times daily with a meal. 01/15/18 02/14/18  Horton, Barbette Hair, MD    Allergies    Patient has no known allergies.  Review of Systems   Review of Systems  Constitutional: Negative for fever.  Musculoskeletal: Positive for arthralgias, gait problem and joint swelling.  Skin: Negative for color change, rash and wound.  Allergic/Immunologic: Positive for immunocompromised state.  Neurological: Negative for weakness and numbness.    Physical Exam Updated Vital Signs BP (!) 136/94 (BP Location: Left Arm)   Pulse 84   Temp 97.7 F (36.5 C) (Oral)   Resp 20   Ht _0  (1.778 m)   Wt 111.1 kg   SpO2 100%  BMI 35.15 kg/m   Physical Exam Vitals and nursing note reviewed.  Constitutional:      General: He is not in acute distress.    Appearance: He is well-developed. He is not diaphoretic.  HENT:     Head: Normocephalic and atraumatic.  Cardiovascular:     Pulses: Normal pulses.  Pulmonary:     Effort: Pulmonary effort is normal.  Musculoskeletal:        General: Swelling and tenderness present. No deformity.     Right knee: Normal.     Left knee: Effusion present. No erythema or bony tenderness. Tenderness present over the medial  joint line. No lateral joint line tenderness.     Right lower leg: No edema.     Left lower leg: No edema.  Skin:    General: Skin is warm and dry.     Findings: No erythema or rash.  Neurological:     Mental Status: He is alert and oriented to person, place, and time.     Sensory: No sensory deficit.     Motor: No weakness.  Psychiatric:        Behavior: Behavior normal.     ED Results / Procedures / Treatments   Labs (all labs ordered are listed, but only abnormal results are displayed) Labs Reviewed - No data to display  EKG None  Radiology DG Knee Complete 4 Views Left  Result Date: 03/15/2021 CLINICAL DATA:  LEFT knee pain and swelling for 2 days, unable to flex, no known injury EXAM: LEFT KNEE - COMPLETE 4+ VIEW COMPARISON:  None FINDINGS: Osseous mineralization normal. Joint space narrowing at medial compartment and patellofemoral joint with patellar spurs. Small joint effusion. No acute fracture, dislocation, or bone destruction. IMPRESSION: Degenerative changes LEFT knee with small associated joint effusion. No acute osseous abnormalities. Electronically Signed   By: Lavonia Dana M.D.   On: 03/15/2021 15:44    Procedures Procedures   Medications Ordered in ED Medications - No data to display  ED Course  I have reviewed the triage vital signs and the nursing notes.  Pertinent labs & imaging results that were available during my care of the patient were reviewed by me and considered in my medical decision making (see chart for details).  Clinical Course as of 03/15/21 1607  Mon Mar 16, 4611  2543 50 year old male with left knee pain without injury. On exam, slight effusion with mild tenderness medially, no erythema. No calf swelling or tenderness. XR shows degenerative changes with slight effusion.  Plan is to stop Motrin, start Meloxicam. Alternate warm/cold compresses and follow up with sports medicine if not improving.  [LM]    Clinical Course User Index [LM]  Roque Lias   MDM Rules/Calculators/A&P                          Final Clinical Impression(s) / ED Diagnoses Final diagnoses:  Osteoarthritis of left knee, unspecified osteoarthritis type  Effusion of left knee    Rx / DC Orders ED Discharge Orders         Ordered    meloxicam (MOBIC) 7.5 MG tablet  Daily        03/15/21 1606           Roque Lias 03/15/21 1607    Truddie Hidden, MD 03/15/21 2157

## 2021-03-16 ENCOUNTER — Ambulatory Visit: Payer: Self-pay

## 2021-03-16 ENCOUNTER — Ambulatory Visit (INDEPENDENT_AMBULATORY_CARE_PROVIDER_SITE_OTHER): Payer: BLUE CROSS/BLUE SHIELD | Admitting: Family Medicine

## 2021-03-16 VITALS — BP 118/80 | Ht 70.0 in | Wt 245.0 lb

## 2021-03-16 DIAGNOSIS — M25562 Pain in left knee: Secondary | ICD-10-CM

## 2021-03-16 DIAGNOSIS — M1712 Unilateral primary osteoarthritis, left knee: Secondary | ICD-10-CM

## 2021-03-16 MED ORDER — TRIAMCINOLONE ACETONIDE 40 MG/ML IJ SUSP
40.0000 mg | Freq: Once | INTRAMUSCULAR | Status: AC
  Administered 2021-03-16: 40 mg via INTRA_ARTICULAR

## 2021-03-16 MED ORDER — PENNSAID 2 % EX SOLN: 1.0000 "application " | Freq: Two times a day (BID) | CUTANEOUS | 2 refills | Status: AC

## 2021-03-16 NOTE — Patient Instructions (Signed)
Nice to meet you Please try ice as needed  Please try cycling instead of running.  Please try the rub on medicine Please try the exercises  Please send me a message in MyChart with any questions or updates.  Please see me back in 4 weeks.   --Dr. Raeford Razor

## 2021-03-16 NOTE — Assessment & Plan Note (Signed)
Acute in nature.  Does have degenerative changes which seem more severe than once appearing on x-ray.  Does also have degenerative change of the meniscus. -Counseled on home exercise therapy and supportive care. -Injection. -pennsaid -Could consider gel injections or physical therapy.

## 2021-03-16 NOTE — Progress Notes (Signed)
Craig Mclean - 50 y.o. male MRN [id number]  Date of birth: [birth year]  SUBJECTIVE:  Including CC & ROS.  No chief complaint on file.   Craig Mclean is a 50 y.o. male that is presenting with left knee pain.  Pain is ongoing for the past few days.  No history of similar pain.  Does run on occasion for exercise.  No history of surgery.  Pain is occurring over the medial joint space.  Independent review left knee x-rays from 3/21 shows moderate medial joint space narrowing.   Review of Systems See HPI   HISTORY: Past Medical, Surgical, Social, and Family History Reviewed & Updated per EMR.   Pertinent Historical Findings include:  Past Medical History:  Diagnosis Date  . Diabetes mellitus without complication ([place])   . Elevated LFTs   . Erectile dysfunction   . GERD (gastroesophageal reflux disease)   . High cholesterol   . Hypertension   . NASH (nonalcoholic steatohepatitis)     No past surgical history on file.  No family history on file.  Social History   Socioeconomic History  . Marital status: Single    Spouse name: Not on file  . Number of children: Not on file  . Years of education: Not on file  . Highest education level: Not on file  Occupational History  . Not on file  Tobacco Use  . Smoking status: Never Smoker  . Smokeless tobacco: Never Used  Vaping Use  . Vaping Use: Never used  Substance and Sexual Activity  . Alcohol use: No  . Drug use: No  . Sexual activity: Not on file  Other Topics Concern  . Not on file  Social History Narrative  . Not on file   Social Determinants of Health   Financial Resource Strain: Not on file  Food Insecurity: Not on file  Transportation Needs: Not on file  Physical Activity: Not on file  Stress: Not on file  Social Connections: Not on file  Intimate Partner Violence: Not on file     PHYSICAL EXAM:  VS: BP 118/80 (BP Location: Right Arm, Patient Position: Sitting, Cuff Size: Large)   Ht 5' 10"  (1.778 m)    Wt 245 lb (111.1 kg)   BMI 35.15 kg/m  Physical Exam Gen: NAD, alert, cooperative with exam, well-appearing MSK:  Left knee: No obvious effusion. Limited range of motion. Normal strength resistance. Tenderness to palpation of the medial joint space. Neurovascular intact  Limited ultrasound: Left knee:  Mild to moderate effusion. Normal-appearing quadricep and patellar tendon. Medial joint space narrowing with degenerative changes of the medial meniscus and demonstrating a tear. Normal-appearing lateral joint space and meniscus  Summary: Degenerative change of the medial compartment  Ultrasound and interpretation by Clearance Coots, MD   Aspiration/Injection Procedure Note Anita Mcadory [birth year]  Procedure: Injection Indications: Left knee pain  Procedure Details Consent: Risks of procedure as well as the alternatives and risks of each were explained to the (patient/caregiver).  Consent for procedure obtained. Time Out: Verified patient identification, verified procedure, site/side was marked, verified correct patient position, special equipment/implants available, medications/allergies/relevent history reviewed, required imaging and test results available.  Performed.  The area was cleaned with iodine and alcohol swabs.    The left knee superior lateral suprapatellar pouch was injected using 1 cc's of 40 mg Kenalog and 4 cc's of 0.25% bupivacaine with a 22 1 1/2" needle.  Ultrasound was used. Images were obtained in long views showing the injection.  A sterile dressing was applied.  Patient did tolerate procedure well.    ASSESSMENT & PLAN:   Primary osteoarthritis of left knee Acute in nature.  Does have degenerative changes which seem more severe than once appearing on x-ray.  Does also have degenerative change of the meniscus. -Counseled on home exercise therapy and supportive care. -Injection. -pennsaid -Could consider gel injections or physical  therapy.

## 2021-04-15 ENCOUNTER — Telehealth: Payer: Self-pay | Admitting: Family Medicine

## 2021-04-15 NOTE — Telephone Encounter (Signed)
John frm [employer] request call back re: Pennsaid Rx--no other info given.   --Forwarding message to med asst to call [phone],Opt#3 & opt#1)  --glh

## 2021-09-06 ENCOUNTER — Other Ambulatory Visit: Payer: Self-pay

## 2021-09-06 ENCOUNTER — Encounter (HOSPITAL_BASED_OUTPATIENT_CLINIC_OR_DEPARTMENT_OTHER): Payer: Self-pay

## 2021-09-06 ENCOUNTER — Emergency Department (HOSPITAL_BASED_OUTPATIENT_CLINIC_OR_DEPARTMENT_OTHER)
Admission: EM | Admit: 2021-09-06 | Discharge: 2021-09-06 | Disposition: A | Discharge status: Home / Self Care | Payer: BLUE CROSS/BLUE SHIELD | Attending: Emergency Medicine | Admitting: Emergency Medicine

## 2021-09-06 DIAGNOSIS — U071 COVID-19: Secondary | ICD-10-CM | POA: Diagnosis not present

## 2021-09-06 DIAGNOSIS — E871 Hypo-osmolality and hyponatremia: Secondary | ICD-10-CM | POA: Diagnosis not present

## 2021-09-06 DIAGNOSIS — Z79899 Other long term (current) drug therapy: Secondary | ICD-10-CM | POA: Diagnosis not present

## 2021-09-06 DIAGNOSIS — I10 Essential (primary) hypertension: Secondary | ICD-10-CM | POA: Insufficient documentation

## 2021-09-06 DIAGNOSIS — E1165 Type 2 diabetes mellitus with hyperglycemia: Secondary | ICD-10-CM | POA: Insufficient documentation

## 2021-09-06 DIAGNOSIS — R42 Dizziness and giddiness: Secondary | ICD-10-CM | POA: Diagnosis not present

## 2021-09-06 DIAGNOSIS — R0981 Nasal congestion: Secondary | ICD-10-CM | POA: Insufficient documentation

## 2021-09-06 DIAGNOSIS — Z7984 Long term (current) use of oral hypoglycemic drugs: Secondary | ICD-10-CM | POA: Diagnosis not present

## 2021-09-06 LAB — CBC WITH DIFFERENTIAL/PLATELET
Abs Immature Granulocytes: 0.01 10*3/uL (ref 0.00–0.07)
Basophils Absolute: 0 10*3/uL (ref 0.0–0.1)
Basophils Relative: 0 %
Eosinophils Absolute: 0 10*3/uL (ref 0.0–0.5)
Eosinophils Relative: 0 %
HCT: 45.6 % (ref 39.0–52.0)
Hemoglobin: 14.9 g/dL (ref 13.0–17.0)
Immature Granulocytes: 0 %
Lymphocytes Relative: 7 %
Lymphs Abs: 0.5 10*3/uL — ABNORMAL LOW (ref 0.7–4.0)
MCH: 28.5 pg (ref 26.0–34.0)
MCHC: 32.7 g/dL (ref 30.0–36.0)
MCV: 87.4 fL (ref 80.0–100.0)
Monocytes Absolute: 0.5 10*3/uL (ref 0.1–1.0)
Monocytes Relative: 7 %
Neutro Abs: 6.1 10*3/uL (ref 1.7–7.7)
Neutrophils Relative %: 86 %
Platelets: 223 10*3/uL (ref 150–400)
RBC: 5.22 MIL/uL (ref 4.22–5.81)
RDW: 12.5 % (ref 11.5–15.5)
WBC: 7.2 10*3/uL (ref 4.0–10.5)
nRBC: 0 % (ref 0.0–0.2)

## 2021-09-06 LAB — CBG MONITORING, ED: Glucose-Capillary: 174 mg/dL — ABNORMAL HIGH (ref 70–99)

## 2021-09-06 LAB — BASIC METABOLIC PANEL
Anion gap: 9 (ref 5–15)
BUN: 17 mg/dL (ref 6–20)
CO2: 26 mmol/L (ref 22–32)
Calcium: 9.2 mg/dL (ref 8.9–10.3)
Chloride: 99 mmol/L (ref 98–111)
Creatinine, Ser: 1.06 mg/dL (ref 0.61–1.24)
GFR, Estimated: >60 mL/min (ref >60)
Glucose, Bld: 148 mg/dL — ABNORMAL HIGH (ref 70–99)
Potassium: 3.7 mmol/L (ref 3.5–5.1)
Sodium: 134 mmol/L — ABNORMAL LOW (ref 135–145)

## 2021-09-06 LAB — RESP PANEL BY RT-PCR (FLU A&B, COVID) ARPGX2
Influenza A by PCR: NEGATIVE
Influenza B by PCR: NEGATIVE
SARS Coronavirus 2 by RT PCR: POSITIVE — AB

## 2021-09-06 IMAGING — CR DG CHEST 2V
2 series · 2 of 2 positions shown · non-contrast
Comparison: 05/29/2020

CLINICAL DATA: Chest pain and dizziness.

EXAM:
CHEST - 2 VIEW

[w chest pa]
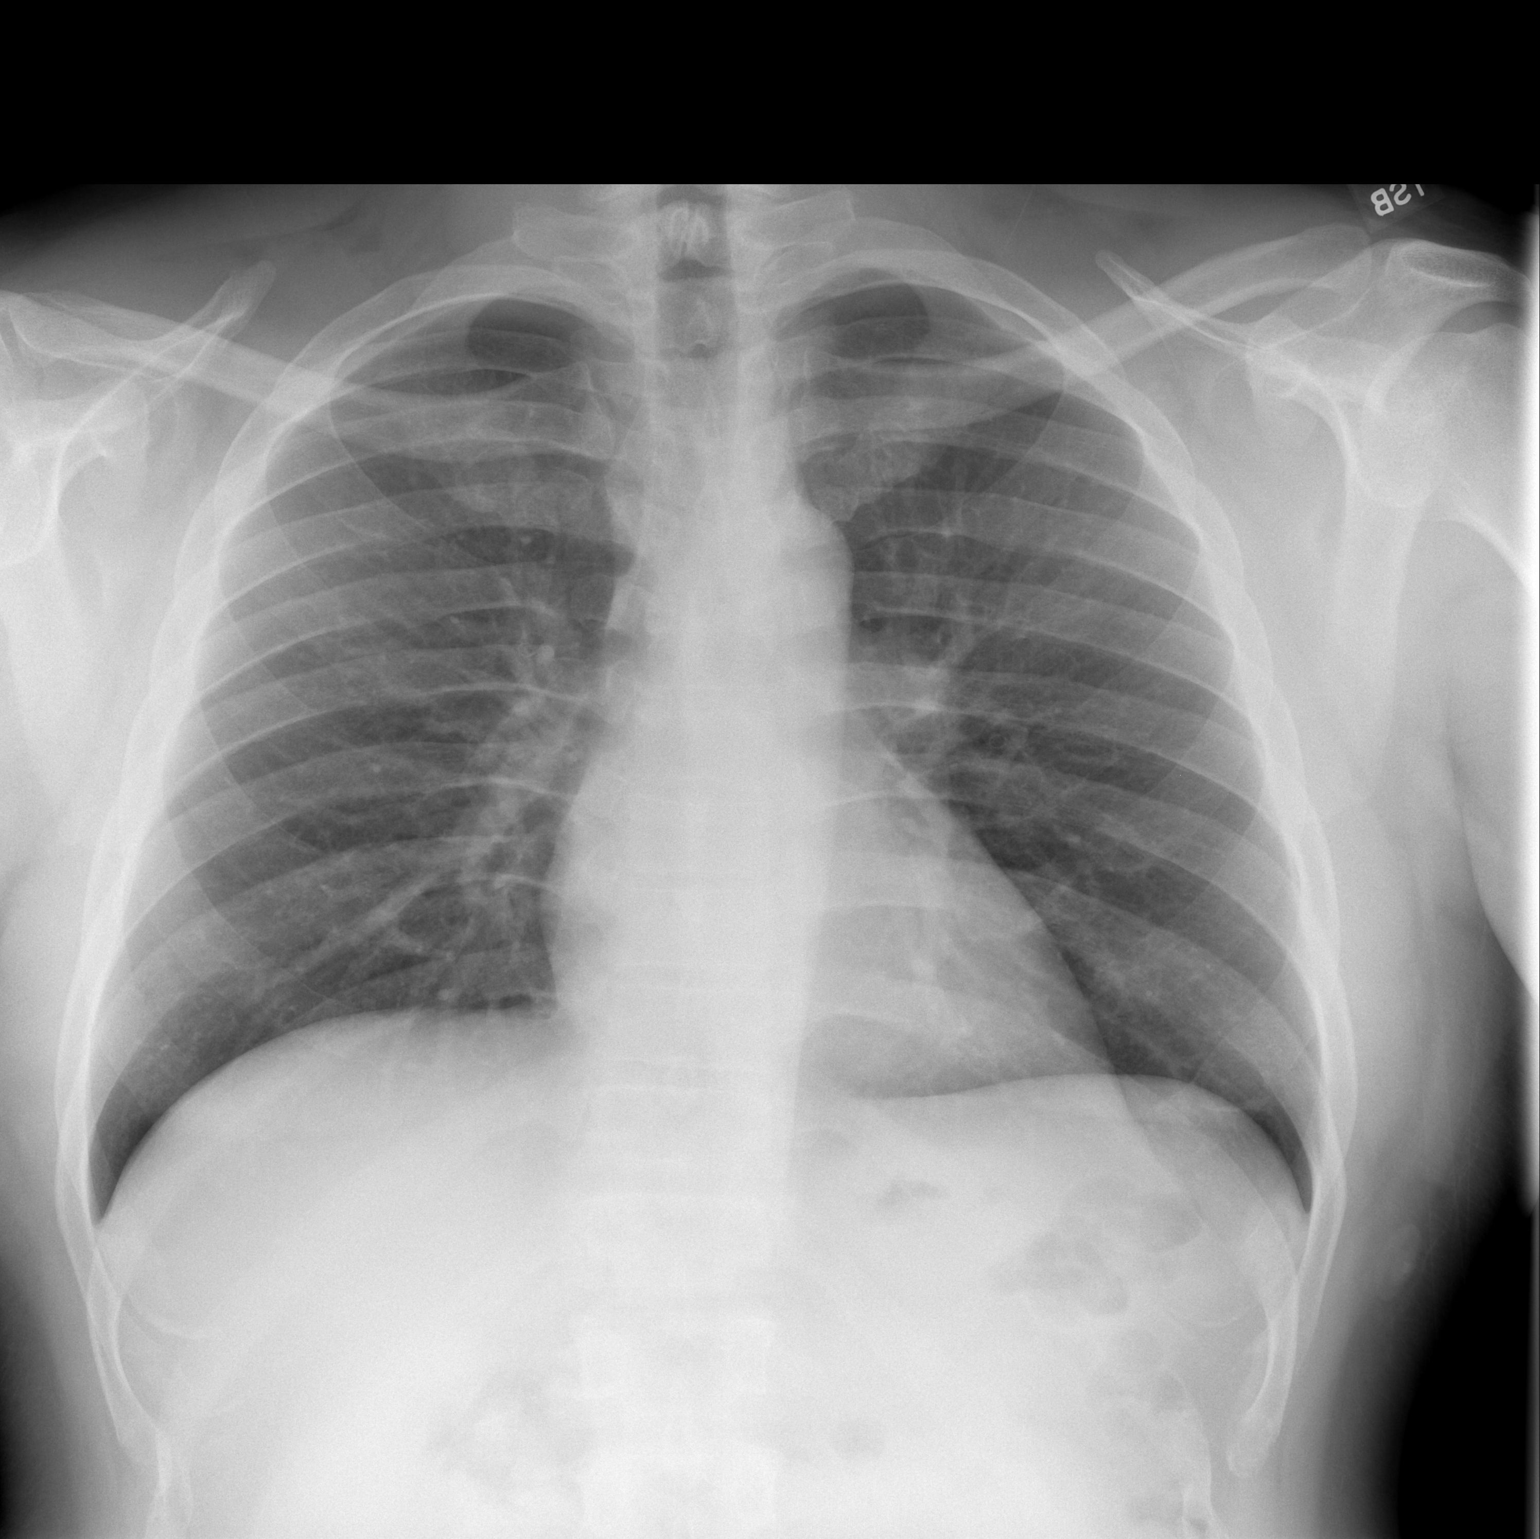

[w chest lat]
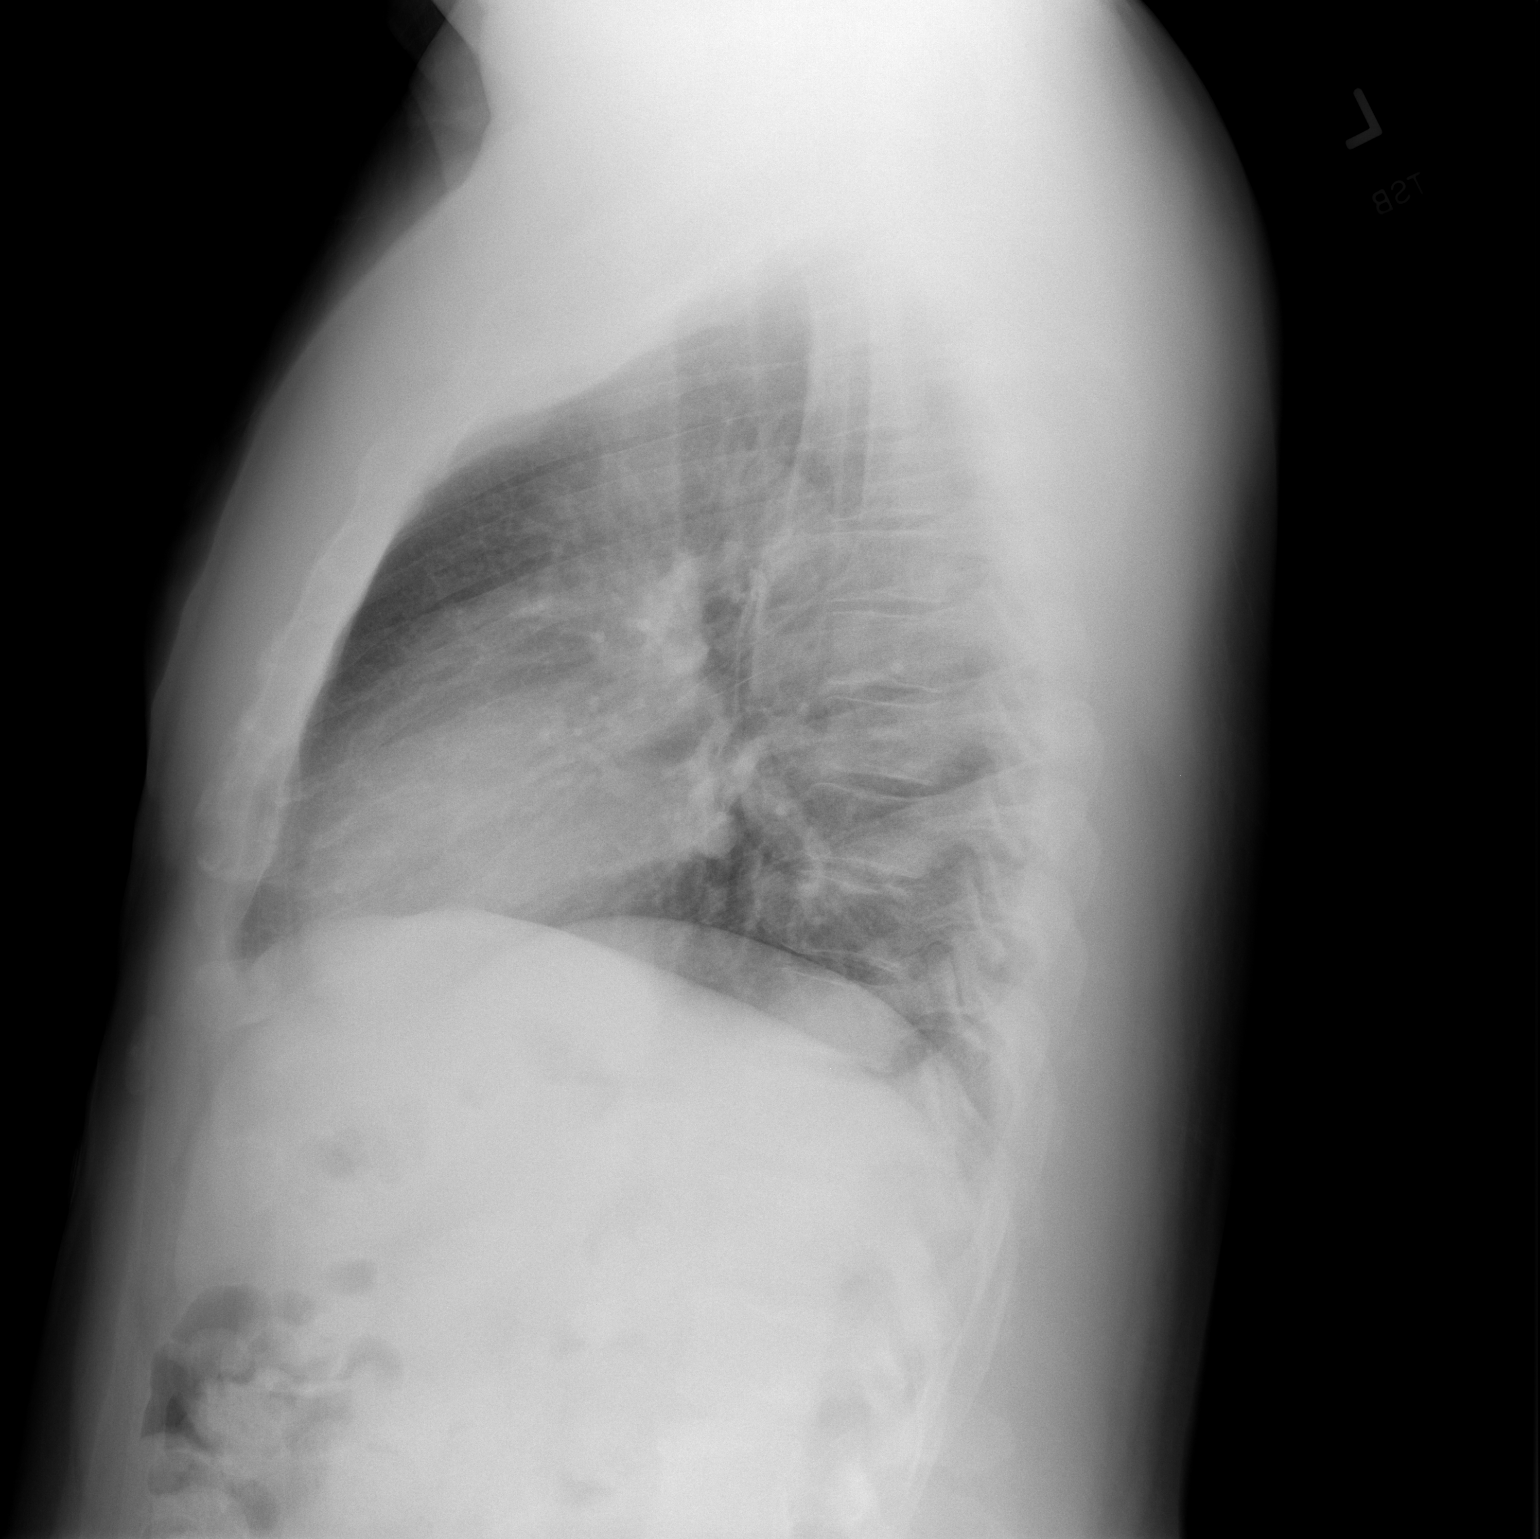

[2 of 2 positions shown; findings below may reference images not displayed]

FINDINGS: The heart size and mediastinal contours are within normal limits.
Both lungs are clear. No pleural effusions or pneumothorax. The
visualized skeletal structures are unremarkable.
IMPRESSION: No active cardiopulmonary disease.

## 2021-09-06 MED ORDER — SODIUM CHLORIDE 0.9 % IV BOLUS
1000.0000 mL | Freq: Once | INTRAVENOUS | Status: AC
  Administered 2021-09-06: 1000 mL via INTRAVENOUS

## 2021-09-06 MED ORDER — ACETAMINOPHEN 325 MG PO TABS
650.0000 mg | ORAL_TABLET | Freq: Once | ORAL | Status: AC
  Administered 2021-09-06: 650 mg via ORAL
  Filled 2021-09-06: qty 2

## 2021-09-06 MED ORDER — NIRMATRELVIR/RITONAVIR (PAXLOVID)TABLET: 3.0000 | ORAL_TABLET | Freq: Two times a day (BID) | ORAL | 0 refills | Status: AC

## 2021-09-06 NOTE — ED Notes (Signed)
Pt ambulated to room, reports being dizzy X2 days states that he is still taking his diabetes medication but has been cutting out sugar and breads. Pt also c/o "black spots" X2 days on left leg.

## 2021-09-06 NOTE — ED Triage Notes (Addendum)
Pt c/o HA, dizziness, sinus pressure started yesterday-concerned his BS may be elevated-did not check PTA-NAD-steady gait

## 2021-09-06 NOTE — ED Provider Notes (Signed)
[facility] Provider Note   CSN: [id number] Arrival date & time: 09/06/21  1300     History Chief Complaint  Patient presents with   Headache    Craig Mclean is a 50 y.o. male with a history of diabetes mellitus, hypertension, hypercholesterolemia, and NASH who presents to the ED with complaints of dizziness intermittently x 2 days. Patient describes the dizziness as lightheaded as if he may pass out, occurs with position changes, improved if he is laying down. Has also had intermittent headache, frontal, gradual onset with steady progression, congestion, and has felt hot at night. Has not tried any medicines for his sxs. He did start trying to cut out sugar two days ago, he has only had eggs & grits so far today. He denies syncope, N/V/D, chest pain, dyspnea, visual disturbance, focal numbness, weakness, or neck stiffness.  HPI     Past Medical History:  Diagnosis Date   Diabetes mellitus without complication (HCC)    Elevated LFTs    Erectile dysfunction    GERD (gastroesophageal reflux disease)    High cholesterol    Hypertension    NASH (nonalcoholic steatohepatitis)     Patient Active Problem List   Diagnosis Date Noted   Primary osteoarthritis of left knee 03/16/2021    History reviewed. No pertinent surgical history.     No family history on file.  Social History   Tobacco Use   Smoking status: Never   Smokeless tobacco: Never  Vaping Use   Vaping Use: Never used  Substance Use Topics   Alcohol use: No   Drug use: No    Home Medications Prior to Admission medications   Medication Sig Start Date End Date Taking? Authorizing Provider  amLODipine (NORVASC) 10 MG tablet Take by mouth. 10/02/20   [provider]  atorvastatin (LIPITOR) 80 MG tablet Take by mouth. 10/02/20   [provider]  blood glucose meter kit and supplies KIT Dispense based on patient and insurance preference. Use up to four times daily  as directed. (FOR ICD-9 250.00, 250.01). 01/15/18   Horton, Barbette Hair, MD  Diclofenac Sodium (PENNSAID) 2 % SOLN Place 1 application onto the skin 2 (two) times daily. 03/16/21   Rosemarie Ax, MD  Empagliflozin-metFORMIN HCl ER (SYNJARDY XR) 12.04-999 MG TB24 Take 2 tablets po qAM 05/19/20   [provider]  gabapentin (NEURONTIN) 100 MG capsule Take by mouth. 02/18/20   [provider]  glipiZIDE (GLUCOTROL) 5 MG tablet Take by mouth. 05/19/20   [provider]  HYDROcodone-acetaminophen (NORCO/VICODIN) 5-325 MG tablet Take 1 tablet by mouth every 6 (six) hours as needed for severe pain. 05/06/18   Blanchie Dessert, MD  lisinopril-hydrochlorothiazide (ZESTORETIC) 20-25 MG tablet Take 1 tablet by mouth daily. 10/02/20   [provider]  metFORMIN (GLUCOPHAGE) 500 MG tablet Take 2 tablets (1,000 mg total) by mouth 2 (two) times daily with a meal. 01/15/18 02/14/18  Horton, Barbette Hair, MD  Semaglutide,0.25 or 0.5MG/DOS, (OZEMPIC, 0.25 OR 0.5 MG/DOSE,) 2 MG/1.5ML SOPN Inject 0.5 mg into the skin once a week. 12/07/20   [provider]    Allergies    Patient has no known allergies.  Review of Systems   Review of Systems  Constitutional:  Positive for fever (feels hot at night, no measured temps).  HENT:  Positive for congestion.   Eyes:  Negative for visual disturbance.  Respiratory:  Negative for shortness of breath.   Cardiovascular:  Negative for chest  pain.  Gastrointestinal:  Negative for abdominal pain, anal bleeding and blood in stool.  Genitourinary:  Negative for dysuria.  Neurological:  Positive for dizziness, light-headedness and headaches. Negative for seizures, syncope, speech difficulty, weakness and numbness.  All other systems reviewed and are negative.  Physical Exam Updated Vital Signs BP 135/90 (BP Location: Left Arm)   Pulse (!) 106   Temp 99.3 F (37.4 C) (Oral)   Resp 20   Ht 5' 11"  (1.803 m)   Wt 116.6 kg   SpO2 98%    BMI 35.84 kg/m   Physical Exam Vitals and nursing note reviewed.  Constitutional:      General: He is not in acute distress.    Appearance: He is well-developed. He is not toxic-appearing.  HENT:     Head: Normocephalic and atraumatic.     Right Ear: Ear canal normal. Tympanic membrane is not perforated, erythematous, retracted or bulging.     Left Ear: Ear canal normal. Tympanic membrane is not perforated, erythematous, retracted or bulging.     Ears:     Comments: No mastoid erythema/swellng/tenderness.     Nose: Congestion present.     Right Sinus: No maxillary sinus tenderness or frontal sinus tenderness.     Left Sinus: No maxillary sinus tenderness or frontal sinus tenderness.     Mouth/Throat:     Pharynx: Oropharynx is clear. Uvula midline. No oropharyngeal exudate or posterior oropharyngeal erythema.     Comments: Posterior oropharynx is symmetric appearing. Patient tolerating own secretions without difficulty. No trismus. No drooling. No hot potato voice. No swelling beneath the tongue, submandibular compartment is soft.  Eyes:     General:        Right eye: No discharge.        Left eye: No discharge.     Conjunctiva/sclera: Conjunctivae normal.  Cardiovascular:     Rate and Rhythm: Normal rate and regular rhythm.  Pulmonary:     Effort: Pulmonary effort is normal. No respiratory distress.     Breath sounds: Normal breath sounds. No wheezing, rhonchi or rales.  Abdominal:     General: There is no distension.     Palpations: Abdomen is soft.     Tenderness: There is no abdominal tenderness.  Musculoskeletal:     Cervical back: Neck supple. No rigidity.  Lymphadenopathy:     Cervical: No cervical adenopathy.  Skin:    General: Skin is warm and dry.     Findings: No rash.  Neurological:     Mental Status: He is alert.     Comments: Alert. Clear speech, CN III-XII grossly intact. Sensation & strength grossly intact x 4.   Psychiatric:        Behavior: Behavior  normal.    ED Results / Procedures / Treatments   Labs (all labs ordered are listed, but only abnormal results are displayed) Labs Reviewed  CBG MONITORING, ED - Abnormal; Notable for the following components:      Result Value   Glucose-Capillary 174 (*)    All other components within normal limits  CBG MONITORING, ED    EKG EKG Interpretation  Date/Time:  Monday September 06 2021 16:31:08 EDT Ventricular Rate:  91 PR Interval:  152 QRS Duration: 96 QT Interval:  349 QTC Calculation: 430 R Axis:   81 Text Interpretation: Sinus rhythm NO ST segment elevation or depression T wave inversions in inferior leads and V4 and V5  as seen on EKG Oct 2021 Stable interverals Stable  rate Confirmed by Campbell Stall ([id number]) on 12/28/7251 6:16:57 PM  Radiology No results found.  Procedures Procedures   Medications Ordered in ED Medications  sodium chloride 0.9 % bolus 1,000 mL (0 mLs Intravenous Stopped 09/06/21 1802)  acetaminophen (TYLENOL) tablet 650 mg (650 mg Oral Given 09/06/21 1643)    ED Course  I have reviewed the triage vital signs and the nursing notes.  Pertinent labs & imaging results that were available during my care of the patient were reviewed by me and considered in my medical decision making (see chart for details).    MDM Rules/Calculators/A&P                          Patient presents to the ED with complaints of dizziness.  Nontoxic, mild tachycardia resolved on my exam.  No focal neuro deficits.   Additional history obtained:  Additional history obtained from chart review & nursing note review.   EKG: Sinus rhythm.   Lab Tests:  I Ordered, reviewed, and interpreted labs, which included:  CBG: 174 CBC: No critical anemia BMP: Mild hyponatremia and hyperglycemia, no significant electrolyte derangement.  Renal function is preserved.  ED Course:   Orthostatic VS for the past 24 hrs:  BP- Lying Pulse- Lying BP- Sitting Pulse- Sitting BP- Standing at 0  minutes Pulse- Standing at 0 minutes  09/06/21 1742 139/90 88 140/89 88 135/86 98   No significant abnormalities orthostatic vital signs however patient has received his full 1 L of fluid prior to this.  He states he is feeling much better following fluids and Tylenol.  Ambulatory and tolerating p.o.  No headache red flags.  No focal neurologic deficits.  Afebrile without nuchal rigidity.  No sudden onset headache.  Cardiac monitor without significant arrhythmias.  Labs without significant anemia or electrolyte derangement.  COVID-19 testing is positive.  Suspect the patient's symptoms are multifactorial with COVID-19, diet change, potentially poor p.o. intake.  Further discussed with the patient and he has not received a COVID-19 vaccine, he does have a history of diabetes, we discussed for/benefits of paxlovid therapy which he would like to proceed with. I discussed results, treatment plan, need for follow-up, and return precautions with the patient. Provided opportunity for questions, patient confirmed understanding and is in agreement with plan.   Portions of this note were generated with [employer]. Dictation errors may occur despite best attempts at proofreading.  Blood pressure 140/90, pulse 89, temperature 99.3 F (37.4 C), temperature source Oral, resp. rate 18, height 5' 11"  (1.803 m), weight 116.6 kg, SpO2 100 %.   Final Clinical Impression(s) / ED Diagnoses Final diagnoses:  Lightheadedness  COVID-19    Rx / DC Orders ED Discharge Orders          Ordered    nirmatrelvir/ritonavir EUA (PAXLOVID) 20 x 150 MG & 10 x 100MG TABS  2 times daily        09/06/21 1823           Craig Mclean was evaluated in Emergency Department on 09/06/2021 for the symptoms described in the history of present illness. He/she was evaluated in the context of the global COVID-19 pandemic, which necessitated consideration that the patient might be at risk for infection with the SARS-CoV-2  virus that causes COVID-19. Institutional protocols and algorithms that pertain to the evaluation of patients at risk for COVID-19 are in a state of rapid change based on information released by regulatory bodies including  the [employer] and federal and state organizations. These policies and algorithms were followed during the patient's care in the ED.    Amaryllis Dyke, PA-C 89/10/02 [id number]    Lianne Cure, DO 49/65/65 1616

## 2021-09-06 NOTE — Discharge Instructions (Addendum)
You are seen in the emergency department today for dizziness and a headache.  Your blood work was overall reassuring, your blood sugar was mildly elevated at 174 when you got here however your repeat labs showed that it was 145.  Your sodium was very mildly low, please have this rechecked by your primary care provider. Please be sure you are still eating 3 meals per day and drinking plenty of fluids. Your covid 19 test is positive.   We are instructing patient's with COVID 19 or symptoms of COVID 19 to follow the below instructions regardless of vaccination status:  Your first day of symptoms is day 0 - Stay home for days 0-5.  - If you have no symptoms or your symptoms are resolving after 5 days, you can leave your house on day 6--> Continue to wear a mask around others for 5 additional days. - If you have a fever, continue to stay home until your fever resolves.   We are starting you on Paxlovid which is an antiviral medication to treat COVID-19.  Please take this as prescribed.  We have prescribed you new medication(s) today. Discuss the medications prescribed today with your pharmacist as they can have adverse effects and interactions with your other medicines including over the counter and prescribed medications. Seek medical evaluation if you start to experience new or abnormal symptoms after taking one of these medicines, seek care immediately if you start to experience difficulty breathing, feeling of your throat closing, facial swelling, or rash as these could be indications of a more serious allergic reaction  Please follow up with primary care within 3-5 days for re-evaluation- call prior to going to the office to make them aware of your symptoms as some offices are altering their method of seeing patients with COVID 19 symptoms, we have also provided our [facility] for follow up as well.  Return to the ER for new or worsening symptoms including but not limited to increased work of  breathing, chest pain, passing out, or any other concerns.       Person Under Monitoring Name: Craig Mclean  Location: [address] [place] [address]   Infection Prevention Recommendations for Individuals Confirmed to have, or Being Evaluated for, 2019 Novel Coronavirus (COVID-19) Infection Who Receive Care at Home  Individuals who are confirmed to have, or are being evaluated for, COVID-19 should follow the prevention steps below until a healthcare provider or local or state health department says they can return to normal activities.  Stay home except to get medical care You should restrict activities outside your home, except for getting medical care. Do not go to work, school, or public areas, and do not use public transportation or taxis.  Call ahead before visiting your doctor Before your medical appointment, call the healthcare provider and tell them that you have, or are being evaluated for, COVID-19 infection. This will help the healthcare provider's office take steps to keep other people from getting infected. Ask your healthcare provider to call the local or state health department.  Monitor your symptoms Seek prompt medical attention if your illness is worsening (e.g., difficulty breathing). Before going to your medical appointment, call the healthcare provider and tell them that you have, or are being evaluated for, COVID-19 infection. Ask your healthcare provider to call the local or state health department.  Wear a facemask You should wear a facemask that covers your nose and mouth when you are in the same room with other  people and when you visit a healthcare provider. People who live with or visit you should also wear a facemask while they are in the same room with you.  Separate yourself from other people in your home As much as possible, you should stay in a different room from other people in your home. Also, you should use a separate bathroom, if  available.  Avoid sharing household items You should not share dishes, drinking glasses, cups, eating utensils, towels, bedding, or other items with other people in your home. After using these items, you should wash them thoroughly with soap and water.  Cover your coughs and sneezes Cover your mouth and nose with a tissue when you cough or sneeze, or you can cough or sneeze into your sleeve. Throw used tissues in a lined trash can, and immediately wash your hands with soap and water for at least 20 seconds or use an alcohol-based hand rub.  Wash your [employer] your hands often and thoroughly with soap and water for at least 20 seconds. You can use an alcohol-based hand sanitizer if soap and water are not available and if your hands are not visibly dirty. Avoid touching your eyes, nose, and mouth with unwashed hands.   Prevention Steps for Caregivers and Household Members of Individuals Confirmed to have, or Being Evaluated for, COVID-19 Infection Being Cared for in the Home  If you live with, or provide care at home for, a person confirmed to have, or being evaluated for, COVID-19 infection please follow these guidelines to prevent infection:  Follow healthcare provider's instructions Make sure that you understand and can help the patient follow any healthcare provider instructions for all care.  Provide for the patient's basic needs You should help the patient with basic needs in the home and provide support for getting groceries, prescriptions, and other personal needs.  Monitor the patient's symptoms If they are getting sicker, call his or her medical provider and tell them that the patient has, or is being evaluated for, COVID-19 infection. This will help the healthcare provider's office take steps to keep other people from getting infected. Ask the healthcare provider to call the local or state health department.  Limit the number of people who have contact with the  patient If possible, have only one caregiver for the patient. Other household members should stay in another home or place of residence. If this is not possible, they should stay in another room, or be separated from the patient as much as possible. Use a separate bathroom, if available. Restrict visitors who do not have an essential need to be in the home.  Keep older adults, very young children, and other sick people away from the patient Keep older adults, very young children, and those who have compromised immune systems or chronic health conditions away from the patient. This includes people with chronic heart, lung, or kidney conditions, diabetes, and cancer.  Ensure good ventilation Make sure that shared spaces in the home have good air flow, such as from an air conditioner or an opened window, weather permitting.  Wash your hands often Wash your hands often and thoroughly with soap and water for at least 20 seconds. You can use an alcohol based hand sanitizer if soap and water are not available and if your hands are not visibly dirty. Avoid touching your eyes, nose, and mouth with unwashed hands. Use disposable paper towels to dry your hands. If not available, use dedicated cloth towels and replace them when  they become wet.  Wear a facemask and gloves Wear a disposable facemask at all times in the room and gloves when you touch or have contact with the patient's blood, body fluids, and/or secretions or excretions, such as sweat, saliva, sputum, nasal mucus, vomit, urine, or feces.  Ensure the mask fits over your nose and mouth tightly, and do not touch it during use. Throw out disposable facemasks and gloves after using them. Do not reuse. Wash your hands immediately after removing your facemask and gloves. If your personal clothing becomes contaminated, carefully remove clothing and launder. Wash your hands after handling contaminated clothing. Place all used disposable facemasks,  gloves, and other waste in a lined container before disposing them with other household waste. Remove gloves and wash your hands immediately after handling these items.  Do not share dishes, glasses, or other household items with the patient Avoid sharing household items. You should not share dishes, drinking glasses, cups, eating utensils, towels, bedding, or other items with a patient who is confirmed to have, or being evaluated for, COVID-19 infection. After the person uses these items, you should wash them thoroughly with soap and water.  Wash laundry thoroughly Immediately remove and wash clothes or bedding that have blood, body fluids, and/or secretions or excretions, such as sweat, saliva, sputum, nasal mucus, vomit, urine, or feces, on them. Wear gloves when handling laundry from the patient. Read and follow directions on labels of laundry or clothing items and detergent. In general, wash and dry with the warmest temperatures recommended on the label.  Clean all areas the individual has used often Clean all touchable surfaces, such as counters, tabletops, doorknobs, bathroom fixtures, toilets, phones, keyboards, tablets, and bedside tables, every day. Also, clean any surfaces that may have blood, body fluids, and/or secretions or excretions on them. Wear gloves when cleaning surfaces the patient has come in contact with. Use a diluted bleach solution (e.g., dilute bleach with 1 part bleach and 10 parts water) or a household disinfectant with a label that says EPA-registered for coronaviruses. To make a bleach solution at home, add 1 tablespoon of bleach to 1 quart (4 cups) of water. For a larger supply, add  cup of bleach to 1 gallon (16 cups) of water. Read labels of cleaning products and follow recommendations provided on product labels. Labels contain instructions for safe and effective use of the cleaning product including precautions you should take when applying the product, such as  wearing gloves or eye protection and making sure you have good ventilation during use of the product. Remove gloves and wash hands immediately after cleaning.  Monitor yourself for signs and symptoms of illness Caregivers and household members are considered close contacts, should monitor their health, and will be asked to limit movement outside of the home to the extent possible. Follow the monitoring steps for close contacts listed on the symptom monitoring form.   ? If you have additional questions, contact your local health department or call the epidemiologist on call at [phone] (available 24/7). ? This guidance is subject to change. For the most up-to-date guidance from [facility], please refer to their website: YouBlogs.pl

## 2021-09-06 NOTE — ED Notes (Signed)
ED Provider at bedside. 

## 2021-11-07 ENCOUNTER — Encounter (HOSPITAL_BASED_OUTPATIENT_CLINIC_OR_DEPARTMENT_OTHER): Payer: Self-pay | Admitting: Emergency Medicine

## 2021-11-07 ENCOUNTER — Emergency Department (HOSPITAL_BASED_OUTPATIENT_CLINIC_OR_DEPARTMENT_OTHER)
Admission: EM | Admit: 2021-11-07 | Discharge: 2021-11-07 | Disposition: A | Discharge status: Home / Self Care | Payer: BLUE CROSS/BLUE SHIELD | Attending: Emergency Medicine | Admitting: Emergency Medicine

## 2021-11-07 DIAGNOSIS — E1165 Type 2 diabetes mellitus with hyperglycemia: Secondary | ICD-10-CM | POA: Insufficient documentation

## 2021-11-07 DIAGNOSIS — R42 Dizziness and giddiness: Secondary | ICD-10-CM

## 2021-11-07 DIAGNOSIS — Z7984 Long term (current) use of oral hypoglycemic drugs: Secondary | ICD-10-CM | POA: Diagnosis not present

## 2021-11-07 DIAGNOSIS — I10 Essential (primary) hypertension: Secondary | ICD-10-CM | POA: Diagnosis not present

## 2021-11-07 DIAGNOSIS — R739 Hyperglycemia, unspecified: Secondary | ICD-10-CM

## 2021-11-07 DIAGNOSIS — Z79899 Other long term (current) drug therapy: Secondary | ICD-10-CM | POA: Insufficient documentation

## 2021-11-07 LAB — CBC WITH DIFFERENTIAL/PLATELET
Abs Immature Granulocytes: 0.01 10*3/uL (ref 0.00–0.07)
Basophils Absolute: 0 10*3/uL (ref 0.0–0.1)
Basophils Relative: 0 %
Eosinophils Absolute: 0.4 10*3/uL (ref 0.0–0.5)
Eosinophils Relative: 6 %
HCT: 43.4 % (ref 39.0–52.0)
Hemoglobin: 14.1 g/dL (ref 13.0–17.0)
Immature Granulocytes: 0 %
Lymphocytes Relative: 35 %
Lymphs Abs: 2.2 10*3/uL (ref 0.7–4.0)
MCH: 28.6 pg (ref 26.0–34.0)
MCHC: 32.5 g/dL (ref 30.0–36.0)
MCV: 88 fL (ref 80.0–100.0)
Monocytes Absolute: 0.5 10*3/uL (ref 0.1–1.0)
Monocytes Relative: 8 %
Neutro Abs: 3.2 10*3/uL (ref 1.7–7.7)
Neutrophils Relative %: 51 %
Platelets: 225 10*3/uL (ref 150–400)
RBC: 4.93 MIL/uL (ref 4.22–5.81)
RDW: 12.6 % (ref 11.5–15.5)
WBC: 6.3 10*3/uL (ref 4.0–10.5)
nRBC: 0 % (ref 0.0–0.2)

## 2021-11-07 LAB — COMPREHENSIVE METABOLIC PANEL
ALT: 64 U/L — ABNORMAL HIGH (ref 0–44)
AST: 36 U/L (ref 15–41)
Albumin: 4 g/dL (ref 3.5–5.0)
Alkaline Phosphatase: 144 U/L — ABNORMAL HIGH (ref 38–126)
Anion gap: 7 (ref 5–15)
BUN: 17 mg/dL (ref 6–20)
CO2: 27 mmol/L (ref 22–32)
Calcium: 8.9 mg/dL (ref 8.9–10.3)
Chloride: 102 mmol/L (ref 98–111)
Creatinine, Ser: 1.09 mg/dL (ref 0.61–1.24)
GFR, Estimated: >60 mL/min (ref >60)
Glucose, Bld: 163 mg/dL — ABNORMAL HIGH (ref 70–99)
Potassium: 3.8 mmol/L (ref 3.5–5.1)
Sodium: 136 mmol/L (ref 135–145)
Total Bilirubin: 0.7 mg/dL (ref 0.3–1.2)
Total Protein: 7.9 g/dL (ref 6.5–8.1)

## 2021-11-07 LAB — CBG MONITORING, ED: Glucose-Capillary: 155 mg/dL — ABNORMAL HIGH (ref 70–99)

## 2021-11-07 MED ORDER — SODIUM CHLORIDE 0.9 % IV BOLUS
1000.0000 mL | Freq: Once | INTRAVENOUS | Status: AC
  Administered 2021-11-07: 1000 mL via INTRAVENOUS

## 2021-11-07 NOTE — Discharge Instructions (Signed)
Continue your current meds for diabetes   You need to keep a detailed record of your blood sugar and show it to your endocrinologist. In particular, take your sugar when you don't feel well and record it   See endocrinologist as scheduled   Return to ER if you have worse dizziness, trouble speaking, weakness, numbness, sugar > 600

## 2021-11-07 NOTE — ED Provider Notes (Signed)
[place] EMERGENCY DEPARTMENT Provider Note   CSN: [id number] Arrival date & time: 11/07/21  1416     History Chief Complaint  Patient presents with  . Dizziness         Craig Mclean is a 50 y.o. male hx of GERD, DM here with dizziness, elevated blood sugar. Patient has history of diabetes. He has been taking ozampic and synjardy. He states that whenever he eats any sweets, he would feel dizzy. He states that he also has black spots on his arms when he eats sweets. Denies trouble speaking or weakness or numbness. He states that he doesn't take his sugar after he eats sweets. He was seen here recently and received IVF and felt better. Denies history of stroke in the past   The history is provided by the patient.      Past Medical History:  Diagnosis Date  . Diabetes mellitus without complication ([place])   . Elevated LFTs   . Erectile dysfunction   . GERD (gastroesophageal reflux disease)   . High cholesterol   . Hypertension   . NASH (nonalcoholic steatohepatitis)     Patient Active Problem List   Diagnosis Date Noted  . Primary osteoarthritis of left knee 03/16/2021    History reviewed. No pertinent surgical history.     No family history on file.  Social History   Tobacco Use  . Smoking status: Never  . Smokeless tobacco: Never  Vaping Use  . Vaping Use: Never used  Substance Use Topics  . Alcohol use: No  . Drug use: No    Home Medications Prior to Admission medications   Medication Sig Start Date End Date Taking? Authorizing Provider  amLODipine (NORVASC) 10 MG tablet Take by mouth. 10/02/20   [provider]  atorvastatin (LIPITOR) 80 MG tablet Take by mouth. 10/02/20   [provider]  blood glucose meter kit and supplies KIT Dispense based on patient and insurance preference. Use up to four times daily as directed. (FOR ICD-9 250.00, 250.01). 01/15/18   Horton, Barbette Hair, MD  Diclofenac Sodium (PENNSAID) 2 % SOLN Place  1 application onto the skin 2 (two) times daily. 03/16/21   Rosemarie Ax, MD  Empagliflozin-metFORMIN HCl ER (SYNJARDY XR) 12.04-999 MG TB24 Take 2 tablets po qAM 05/19/20   [provider]  gabapentin (NEURONTIN) 100 MG capsule Take by mouth. 02/18/20   [provider]  glipiZIDE (GLUCOTROL) 5 MG tablet Take by mouth. 05/19/20   [provider]  HYDROcodone-acetaminophen (NORCO/VICODIN) 5-325 MG tablet Take 1 tablet by mouth every 6 (six) hours as needed for severe pain. 05/06/18   Blanchie Dessert, MD  lisinopril-hydrochlorothiazide (ZESTORETIC) 20-25 MG tablet Take 1 tablet by mouth daily. 10/02/20   [provider]  metFORMIN (GLUCOPHAGE) 500 MG tablet Take 2 tablets (1,000 mg total) by mouth 2 (two) times daily with a meal. 01/15/18 02/14/18  Horton, Barbette Hair, MD  Semaglutide,0.25 or 0.5MG/DOS, (OZEMPIC, 0.25 OR 0.5 MG/DOSE,) 2 MG/1.5ML SOPN Inject 0.5 mg into the skin once a week. 12/07/20   [provider]    Allergies    Patient has no known allergies.  Review of Systems   Review of Systems  Neurological:  Positive for dizziness.  All other systems reviewed and are negative.  Physical Exam Updated Vital Signs BP (!) 135/101 (BP Location: Left Arm)   Pulse 73   Temp 97.9 F (36.6 C) (Oral)   Resp 16   Ht 5' 11"  (1.803 m)  Wt 117.9 kg   SpO2 99%   BMI 36.26 kg/m   Physical Exam Vitals and nursing note reviewed.  Constitutional:      Appearance: Normal appearance.  HENT:     Head: Normocephalic.     Nose: Nose normal.     Mouth/Throat:     Mouth: Mucous membranes are moist.  Eyes:     Extraocular Movements: Extraocular movements intact.     Pupils: Pupils are equal, round, and reactive to light.  Cardiovascular:     Rate and Rhythm: Normal rate and regular rhythm.     Pulses: Normal pulses.     Heart sounds: Normal heart sounds.  Pulmonary:     Effort: Pulmonary effort is normal.     Breath sounds: Normal breath  sounds.  Abdominal:     General: Abdomen is flat.     Palpations: Abdomen is soft.  Musculoskeletal:        General: Normal range of motion.     Cervical back: Normal range of motion and neck supple.  Skin:    General: Skin is warm.     Capillary Refill: Capillary refill takes less than 2 seconds.  Neurological:     General: No focal deficit present.     Mental Status: He is alert and oriented to person, place, and time.  Psychiatric:        Mood and Affect: Mood normal.        Behavior: Behavior normal.    ED Results / Procedures / Treatments   Labs (all labs ordered are listed, but only abnormal results are displayed) Labs Reviewed  COMPREHENSIVE METABOLIC PANEL - Abnormal; Notable for the following components:      Result Value   Glucose, Bld 163 (*)    ALT 64 (*)    Alkaline Phosphatase 144 (*)    All other components within normal limits  CBG MONITORING, ED - Abnormal; Notable for the following components:   Glucose-Capillary 155 (*)    All other components within normal limits  CBC WITH DIFFERENTIAL/PLATELET    EKG EKG Interpretation  Date/Time:  Sunday November 07 2021 14:34:53 EST Ventricular Rate:  81 PR Interval:  158 QRS Duration: 88 QT Interval:  360 QTC Calculation: 418 R Axis:   70 Text Interpretation: Normal sinus rhythm T wave abnormality, consider inferolateral ischemia Abnormal ECG No significant change since last tracing Confirmed by Wandra Arthurs [phone]) on 11/07/2021 4:11:22 PM  Radiology No results found.  Procedures Procedures   Medications Ordered in ED Medications  sodium chloride 0.9 % bolus 1,000 mL (1,000 mLs Intravenous New Bag/Given 11/07/21 1638)    ED Course  I have reviewed the triage vital signs and the nursing notes.  Pertinent labs & imaging results that were available during my care of the patient were reviewed by me and considered in my medical decision making (see chart for details).    MDM Rules/Calculators/A&P                            Craig Mclean is a 50 y.o. male here with hyperglycemia, dizziness. He states that his blood sugar is elevated when he eat sweets. However, he doesn't check it at home. Glucose is 160 in the ED. No focal neuro deficit to suggest stroke. Not orthostatic. Given IVF and felt better. I wonder if he has transient increase in his blood sugar when he eat sweets. Told him to check CBG often  and keep a detailed record and show it to his endocrinologist.    Final Clinical Impression(s) / ED Diagnoses Final diagnoses:  None    Rx / DC Orders ED Discharge Orders     None        Drenda Freeze, MD 11/07/21 1659

## 2021-11-07 NOTE — ED Triage Notes (Signed)
Pt out of diabetes meds x 1 wk; c/o dizziness/lightheadedness

## 2021-11-07 NOTE — ED Notes (Signed)
EDP finished at [facility]. Pt updated. Pt speaking on phone. Alert, NAD, calm, interactive, resps e/u. Admits to intermittent dizziness and nausea. Denies pain, sob, vomiting or weakness.

## 2022-01-17 ENCOUNTER — Other Ambulatory Visit (HOSPITAL_BASED_OUTPATIENT_CLINIC_OR_DEPARTMENT_OTHER): Payer: Self-pay

## 2022-01-17 MED ORDER — OZEMPIC (0.25 OR 0.5 MG/DOSE) 2 MG/1.5ML ~~LOC~~ SOPN
PEN_INJECTOR | SUBCUTANEOUS | 2 refills | Status: AC
  Filled 2022-01-17 – 2022-01-18 (×2): qty 1.5, 28d supply, fill #0
  Filled 2022-02-15: qty 1.5, 28d supply, fill #1
  Filled 2022-03-31: qty 1.5, 28d supply, fill #2

## 2022-01-18 ENCOUNTER — Other Ambulatory Visit (HOSPITAL_BASED_OUTPATIENT_CLINIC_OR_DEPARTMENT_OTHER): Payer: Self-pay

## 2022-01-19 ENCOUNTER — Other Ambulatory Visit (HOSPITAL_BASED_OUTPATIENT_CLINIC_OR_DEPARTMENT_OTHER): Payer: Self-pay

## 2022-01-20 ENCOUNTER — Other Ambulatory Visit (HOSPITAL_BASED_OUTPATIENT_CLINIC_OR_DEPARTMENT_OTHER): Payer: Self-pay

## 2022-01-21 ENCOUNTER — Other Ambulatory Visit (HOSPITAL_BASED_OUTPATIENT_CLINIC_OR_DEPARTMENT_OTHER): Payer: Self-pay

## 2022-02-15 ENCOUNTER — Other Ambulatory Visit (HOSPITAL_BASED_OUTPATIENT_CLINIC_OR_DEPARTMENT_OTHER): Payer: Self-pay

## 2022-03-22 ENCOUNTER — Emergency Department (HOSPITAL_BASED_OUTPATIENT_CLINIC_OR_DEPARTMENT_OTHER)
Admission: EM | Admit: 2022-03-22 | Discharge: 2022-03-22 | Disposition: A | Discharge status: Home / Self Care | Payer: 59 | Attending: Emergency Medicine | Admitting: Emergency Medicine

## 2022-03-22 ENCOUNTER — Other Ambulatory Visit: Payer: Self-pay

## 2022-03-22 ENCOUNTER — Encounter (HOSPITAL_BASED_OUTPATIENT_CLINIC_OR_DEPARTMENT_OTHER): Payer: Self-pay | Admitting: Emergency Medicine

## 2022-03-22 DIAGNOSIS — E1165 Type 2 diabetes mellitus with hyperglycemia: Secondary | ICD-10-CM | POA: Insufficient documentation

## 2022-03-22 DIAGNOSIS — K0889 Other specified disorders of teeth and supporting structures: Secondary | ICD-10-CM | POA: Insufficient documentation

## 2022-03-22 DIAGNOSIS — I10 Essential (primary) hypertension: Secondary | ICD-10-CM | POA: Diagnosis not present

## 2022-03-22 DIAGNOSIS — G8929 Other chronic pain: Secondary | ICD-10-CM | POA: Insufficient documentation

## 2022-03-22 LAB — CBG MONITORING, ED: Glucose-Capillary: 161 mg/dL — ABNORMAL HIGH (ref 70–99)

## 2022-03-22 MED ORDER — IBUPROFEN 800 MG PO TABS: 800.0000 mg | ORAL_TABLET | Freq: Three times a day (TID) | ORAL | 0 refills | Status: AC | PRN

## 2022-03-22 MED ORDER — IBUPROFEN 800 MG PO TABS
800.0000 mg | ORAL_TABLET | Freq: Once | ORAL | Status: AC
  Administered 2022-03-22: 800 mg via ORAL
  Filled 2022-03-22: qty 1

## 2022-03-22 NOTE — ED Provider Notes (Signed)
? ?Emergency Department Provider Note ? ? ?I have reviewed the triage vital signs and the nursing notes. ? ? ?HISTORY ? ?Chief Complaint ?Dental Pain ? ? ?HPI ?Craig Mclean is a 51 y.o. male presents to the ED with elevated CBG and dental pain. Patient with chronic dental pain. Describes plan for dental evaluation in the next several weeks for evaluation and possible extraction. Pain has returned and is severe. No voice change. No difficulty swallowing. No fever. Notes that CBG has also been high.   ? ? ?Past Medical History:  ?Diagnosis Date  ? Diabetes mellitus without complication ([place])   ? Elevated LFTs   ? Erectile dysfunction   ? GERD (gastroesophageal reflux disease)   ? High cholesterol   ? Hypertension   ? NASH (nonalcoholic steatohepatitis)   ? ? ?Review of Systems ? ?Constitutional: No fever/chills ?Eyes: No visual changes. ?ENT: No sore throat. Positive dental pain.  ?Cardiovascular: Denies chest pain. ?Respiratory: Denies shortness of breath. ?Gastrointestinal: No abdominal pain.  No nausea, no vomiting.  No diarrhea.  No constipation. ?Genitourinary: Negative for dysuria. ?Musculoskeletal: Negative for back pain. ?Skin: Negative for rash. ?Neurological: Negative for headaches, focal weakness or numbness. ? ?____________________________________________ ? ? ?PHYSICAL EXAM: ? ?VITAL SIGNS: ?ED Triage Vitals  ?Enc Vitals Group  ?   BP 03/22/22 1822 (!) 148/87  ?   Pulse Rate 03/22/22 1822 95  ?   Resp 03/22/22 1822 18  ?   Temp 03/22/22 1822 98 ?F (36.7 ?C)  ?   Temp Source 03/22/22 1822 Oral  ?   SpO2 03/22/22 1822 99 %  ? ?Constitutional: Alert and oriented. Well appearing and in no acute distress. ?Eyes: Conjunctivae are normal. ?Head: Atraumatic. ?Nose: No congestion/rhinnorhea. ?Mouth/Throat: Mucous membranes are moist.  Oropharynx non-erythematous. No trismus. Soft submandibular compartment.  ?Neck: No stridor. ?Cardiovascular: Normal rate, regular rhythm. Good peripheral circulation. Grossly  normal heart sounds.   ?Respiratory: Normal respiratory effort.  No retractions. Lungs CTAB. ?Gastrointestinal: Soft and nontender. No distention.  ?Musculoskeletal: No lower extremity tenderness nor edema. No gross deformities of extremities. ?Neurologic:  Normal speech and language. No gross focal neurologic deficits are appreciated.  ?Skin:  Skin is warm, dry and intact. No rash noted. ? ?____________________________________________ ?  ?LABS ?(all labs ordered are listed, but only abnormal results are displayed) ? ?Labs Reviewed  ?CBG MONITORING, ED - Abnormal; Notable for the following components:  ?    Result Value  ? Glucose-Capillary 161 (*)   ? All other components within normal limits  ? ?____________________________________________ ? ? ?PROCEDURES ? ?Procedure(s) performed:  ? ?Procedures ? ?None ?____________________________________________ ? ? ?INITIAL IMPRESSION / ASSESSMENT AND PLAN / ED COURSE ? ?Pertinent labs & imaging results that were available during my care of the patient were reviewed by me and considered in my medical decision making (see chart for details). ?  ?This patient is Presenting for Evaluation of dental pain, which does require a range of treatment options, and is a complaint that involves a high risk of morbidity and mortality. ? ?The Differential Diagnoses include dental carries, dental abscess, fracture, Ludwig's angina, deep space neck infection. ? ?  ?Clinical Laboratory Tests Ordered, included CBG mildly elevated.  ? ?Social Determinants of Health Risk patient is a non-smoker.  ? ?Medical Decision Making: Summary:  ?Patient presents to the ED with dental pain. No hard signs on exam to suspect developing ludwig's angina or deep space infection. Plan for NSAIDs and keep PCP follow up. No infectious symptoms to  prompt abx. Mild hyperglycemia. Patient has DM medications at home. Discussed diet modifications and PCP follow up.  ? ?Reevaluation with update and discussion with  patient. Patient in agreement with plan at discharge.  ? ?Disposition: discharge ? ?____________________________________________ ? ?FINAL CLINICAL IMPRESSION(S) / ED DIAGNOSES ? ?Final diagnoses:  ?Pain, dental  ? ? ? ?NEW OUTPATIENT MEDICATIONS STARTED DURING THIS VISIT: ? ?Discharge Medication List as of 03/22/2022  7:01 PM  ?  ? ?START taking these medications  ? Details  ?ibuprofen (ADVIL) 800 MG tablet Take 1 tablet (800 mg total) by mouth every 8 (eight) hours as needed., Starting Tue 03/22/2022, Normal  ?  ?  ? ? ?Note:  This document was prepared using Dragon voice recognition software and may include unintentional dictation errors. ? ?Nanda Quinton, MD, FACEP ?Emergency Medicine ? ?  ?Margette Fast, MD ?03/30/22 1149 ? ?

## 2022-03-22 NOTE — Discharge Instructions (Signed)
You were seen in the emergency room today with dental pain.  I am sending in a prescription for ibuprofen and will have you follow with your primary care doctor moving forward along with your dentist. ?

## 2022-03-22 NOTE — ED Triage Notes (Signed)
Pt reports dental pain and hyperglycemia. Pt reports he is wanting something to help with the dental pain. Pt has appt to get teeth removed next month.  ?

## 2022-03-31 ENCOUNTER — Other Ambulatory Visit (HOSPITAL_BASED_OUTPATIENT_CLINIC_OR_DEPARTMENT_OTHER): Payer: Self-pay

## 2022-04-01 ENCOUNTER — Other Ambulatory Visit (HOSPITAL_BASED_OUTPATIENT_CLINIC_OR_DEPARTMENT_OTHER): Payer: Self-pay

## 2022-04-05 ENCOUNTER — Other Ambulatory Visit (HOSPITAL_BASED_OUTPATIENT_CLINIC_OR_DEPARTMENT_OTHER): Payer: Self-pay

## 2022-04-20 ENCOUNTER — Emergency Department (HOSPITAL_BASED_OUTPATIENT_CLINIC_OR_DEPARTMENT_OTHER): Payer: Self-pay

## 2022-04-20 ENCOUNTER — Other Ambulatory Visit: Payer: Self-pay

## 2022-04-20 ENCOUNTER — Encounter (HOSPITAL_BASED_OUTPATIENT_CLINIC_OR_DEPARTMENT_OTHER): Payer: Self-pay | Admitting: Emergency Medicine

## 2022-04-20 ENCOUNTER — Emergency Department (HOSPITAL_BASED_OUTPATIENT_CLINIC_OR_DEPARTMENT_OTHER)
Admission: EM | Admit: 2022-04-20 | Discharge: 2022-04-20 | Disposition: A | Discharge status: Home / Self Care | Payer: Self-pay | Attending: Emergency Medicine | Admitting: Emergency Medicine

## 2022-04-20 DIAGNOSIS — K0889 Other specified disorders of teeth and supporting structures: Secondary | ICD-10-CM

## 2022-04-20 DIAGNOSIS — E1165 Type 2 diabetes mellitus with hyperglycemia: Secondary | ICD-10-CM | POA: Insufficient documentation

## 2022-04-20 DIAGNOSIS — R0602 Shortness of breath: Secondary | ICD-10-CM

## 2022-04-20 DIAGNOSIS — R739 Hyperglycemia, unspecified: Secondary | ICD-10-CM

## 2022-04-20 DIAGNOSIS — Z79899 Other long term (current) drug therapy: Secondary | ICD-10-CM | POA: Insufficient documentation

## 2022-04-20 DIAGNOSIS — Z7984 Long term (current) use of oral hypoglycemic drugs: Secondary | ICD-10-CM | POA: Insufficient documentation

## 2022-04-20 LAB — CBC WITH DIFFERENTIAL/PLATELET
Abs Immature Granulocytes: 0.02 10*3/uL (ref 0.00–0.07)
Basophils Absolute: 0 10*3/uL (ref 0.0–0.1)
Basophils Relative: 0 %
Eosinophils Absolute: 0.3 10*3/uL (ref 0.0–0.5)
Eosinophils Relative: 3 %
HCT: 43.2 % (ref 39.0–52.0)
Hemoglobin: 14 g/dL (ref 13.0–17.0)
Immature Granulocytes: 0 %
Lymphocytes Relative: 22 %
Lymphs Abs: 2 10*3/uL (ref 0.7–4.0)
MCH: 28.4 pg (ref 26.0–34.0)
MCHC: 32.4 g/dL (ref 30.0–36.0)
MCV: 87.6 fL (ref 80.0–100.0)
Monocytes Absolute: 0.6 10*3/uL (ref 0.1–1.0)
Monocytes Relative: 7 %
Neutro Abs: 6.4 10*3/uL (ref 1.7–7.7)
Neutrophils Relative %: 68 %
Platelets: 215 10*3/uL (ref 150–400)
RBC: 4.93 MIL/uL (ref 4.22–5.81)
RDW: 13 % (ref 11.5–15.5)
WBC: 9.3 10*3/uL (ref 4.0–10.5)
nRBC: 0 % (ref 0.0–0.2)

## 2022-04-20 LAB — BASIC METABOLIC PANEL
Anion gap: 9 (ref 5–15)
BUN: 16 mg/dL (ref 6–20)
CO2: 26 mmol/L (ref 22–32)
Calcium: 8.9 mg/dL (ref 8.9–10.3)
Chloride: 103 mmol/L (ref 98–111)
Creatinine, Ser: 1.28 mg/dL — ABNORMAL HIGH (ref 0.61–1.24)
GFR, Estimated: >60 mL/min (ref >60)
Glucose, Bld: 187 mg/dL — ABNORMAL HIGH (ref 70–99)
Potassium: 3.7 mmol/L (ref 3.5–5.1)
Sodium: 138 mmol/L (ref 135–145)

## 2022-04-20 LAB — CBG MONITORING, ED: Glucose-Capillary: 125 mg/dL — ABNORMAL HIGH (ref 70–99)

## 2022-04-20 LAB — TROPONIN I (HIGH SENSITIVITY): Troponin I (High Sensitivity): 3 ng/L (ref <18)

## 2022-04-20 MED ORDER — HYDROCODONE-ACETAMINOPHEN 5-325 MG PO TABS
1.0000 | ORAL_TABLET | Freq: Once | ORAL | Status: AC
  Administered 2022-04-20: 1 via ORAL
  Filled 2022-04-20: qty 1

## 2022-04-20 MED ORDER — HYDROCODONE-ACETAMINOPHEN 5-325 MG PO TABS: 1.0000 | ORAL_TABLET | Freq: Four times a day (QID) | ORAL | 0 refills | Status: DC | PRN

## 2022-04-20 MED ORDER — PENICILLIN V POTASSIUM 500 MG PO TABS: 500.0000 mg | ORAL_TABLET | Freq: Four times a day (QID) | ORAL | 0 refills | Status: AC

## 2022-04-20 NOTE — Discharge Instructions (Signed)
Follow-up with your regular doctor for closer blood sugar checks.  Take the penicillin antibiotic for the tooth pain.  Take the hydrocodone as needed for pain.  Make an appointment to follow-up with Dr. Haig Prophet who is available as an adult dentist.  Call and set up an appointment.  Work-up otherwise without any acute findings.  Blood sugar was 187.  Chest x-ray was negative. ?

## 2022-04-20 NOTE — ED Provider Notes (Signed)
?[place] EMERGENCY DEPARTMENT ?Provider Note ? ? ?CSN: [id number] ?Arrival date & time: 04/20/22  2020 ? ?  ? ?History ? ?Chief Complaint  ?Patient presents with  ? Shortness of Breath  ? ? ?Craig Mclean is a 51 y.o. male. ? ?Patient here with several complaints.  1 is shortness of breath for 3 days.  The other is that his blood sugars have been running high.  But less than 200 for the most part.  And his blood pressures been elevated.  But also with a complaint of left upper tooth pain.  Where he has a lot of decay of the teeth down to the level of the gum.  That is been hurting for the past few days.  But has gotten worse.  No fever able to swallow able to talk okay.  No chest pain.  No leg swelling. ? ?Past medical history significant for diabetes and patient is on metformin for that.  Hypertension high cholesterol gastroesophageal reflux disease.  His primary care doctor is at [facility]. ? ? ?  ? ?Home Medications ?Prior to Admission medications   ?Medication Sig Start Date End Date Taking? Authorizing Provider  ?HYDROcodone-acetaminophen (NORCO/VICODIN) 5-325 MG tablet Take 1 tablet by mouth every 6 (six) hours as needed for moderate pain. 04/20/22  Yes Fredia Sorrow, MD  ?penicillin v potassium (VEETID) 500 MG tablet Take 1 tablet (500 mg total) by mouth 4 (four) times daily for 7 days. 04/20/22 04/27/22 Yes Fredia Sorrow, MD  ?amLODipine (NORVASC) 10 MG tablet Take by mouth. 10/02/20   [provider]  ?atorvastatin (LIPITOR) 80 MG tablet Take by mouth. 10/02/20   [provider]  ?blood glucose meter kit and supplies KIT Dispense based on patient and insurance preference. Use up to four times daily as directed. (FOR ICD-9 250.00, 250.01). 01/15/18   Horton, Barbette Hair, MD  ?Diclofenac Sodium (PENNSAID) 2 % SOLN Place 1 application onto the skin 2 (two) times daily. 03/16/21   Rosemarie Ax, MD  ?Empagliflozin-metFORMIN HCl ER (SYNJARDY XR) 12.04-999 MG TB24 Take 2 tablets po  qAM 05/19/20   [provider]  ?gabapentin (NEURONTIN) 100 MG capsule Take by mouth. 02/18/20   [provider]  ?glipiZIDE (GLUCOTROL) 5 MG tablet Take by mouth. 05/19/20   [provider]  ?HYDROcodone-acetaminophen (NORCO/VICODIN) 5-325 MG tablet Take 1 tablet by mouth every 6 (six) hours as needed for severe pain. 05/06/18   Blanchie Dessert, MD  ?ibuprofen (ADVIL) 800 MG tablet Take 1 tablet (800 mg total) by mouth every 8 (eight) hours as needed. 03/22/22   Long, Wonda Olds, MD  ?lisinopril-hydrochlorothiazide (ZESTORETIC) 20-25 MG tablet Take 1 tablet by mouth daily. 10/02/20   [provider]  ?metFORMIN (GLUCOPHAGE) 500 MG tablet Take 2 tablets (1,000 mg total) by mouth 2 (two) times daily with a meal. 01/15/18 02/14/18  Horton, Barbette Hair, MD  ?Semaglutide,0.25 or 0.5MG/DOS, (OZEMPIC, 0.25 OR 0.5 MG/DOSE,) 2 MG/1.5ML SOPN Inject 0.5 mg into the skin once a week. 12/07/20   [provider]  ?Semaglutide,0.25 or 0.5MG/DOS, (OZEMPIC, 0.25 OR 0.5 MG/DOSE,) 2 MG/1.5ML SOPN Inject 0.64m once a week 01/17/22     ?   ? ?Allergies    ?Patient has no known allergies.   ? ?Review of Systems   ?Review of Systems  ?Constitutional:  Negative for chills and fever.  ?HENT:  Positive for dental problem. Negative for ear pain and sore throat.   ?Eyes:  Negative for pain and visual disturbance.  ?  Respiratory:  Positive for shortness of breath. Negative for cough.   ?Cardiovascular:  Negative for chest pain and palpitations.  ?Gastrointestinal:  Negative for abdominal pain and vomiting.  ?Genitourinary:  Negative for dysuria and hematuria.  ?Musculoskeletal:  Negative for arthralgias and back pain.  ?Skin:  Negative for color change and rash.  ?Neurological:  Negative for seizures and syncope.  ?All other systems reviewed and are negative. ? ?Physical Exam ?Updated Vital Signs ?BP (!) 161/104 (BP Location: Right Arm)   Pulse 94   Temp 98.3 ?F (36.8 ?C) (Oral)   Resp 19   Ht 1.803 m (5'  11")   Wt 115.7 kg   SpO2 100%   BMI 35.57 kg/m?  ?Physical Exam ?Vitals and nursing note reviewed.  ?Constitutional:   ?   General: He is not in acute distress. ?   Appearance: Normal appearance. He is well-developed.  ?HENT:  ?   Head: Normocephalic and atraumatic.  ?   Mouth/Throat:  ?   Comments: Mucous membranes are moist.  Multiple decaying teeth in the left upper part of the mouth.  With much of the decay right at the gum baseline.  With some swelling to the most posterior 1.  No swelling to the floor of the mouth. ?Eyes:  ?   Conjunctiva/sclera: Conjunctivae normal.  ?   Pupils: Pupils are equal, round, and reactive to light.  ?Cardiovascular:  ?   Rate and Rhythm: Normal rate and regular rhythm.  ?   Heart sounds: No murmur heard. ?Pulmonary:  ?   Effort: Pulmonary effort is normal. No respiratory distress.  ?   Breath sounds: Normal breath sounds. No wheezing, rhonchi or rales.  ?Chest:  ?   Chest wall: No tenderness.  ?Abdominal:  ?   Palpations: Abdomen is soft.  ?   Tenderness: There is no abdominal tenderness.  ?Musculoskeletal:     ?   General: No swelling.  ?   Cervical back: Normal range of motion and neck supple.  ?   Right lower leg: No edema.  ?   Left lower leg: No edema.  ?Skin: ?   General: Skin is warm and dry.  ?   Capillary Refill: Capillary refill takes less than 2 seconds.  ?Neurological:  ?   General: No focal deficit present.  ?   Mental Status: He is alert and oriented to person, place, and time.  ?Psychiatric:     ?   Mood and Affect: Mood normal.  ? ? ?ED Results / Procedures / Treatments   ?Labs ?(all labs ordered are listed, but only abnormal results are displayed) ?Labs Reviewed  ?BASIC METABOLIC PANEL - Abnormal; Notable for the following components:  ?    Result Value  ? Glucose, Bld 187 (*)   ? Creatinine, Ser 1.28 (*)   ? All other components within normal limits  ?CBG MONITORING, ED - Abnormal; Notable for the following components:  ? Glucose-Capillary 125 (*)   ? All  other components within normal limits  ?CBC WITH DIFFERENTIAL/PLATELET  ?TROPONIN I (HIGH SENSITIVITY)  ?TROPONIN I (HIGH SENSITIVITY)  ? ? ?EKG ?EKG Interpretation ? ?Date/Time:  Wednesday April 20 2022 21:04:47 EDT ?Ventricular Rate:  103 ?PR Interval:  156 ?QRS Duration: 94 ?QT Interval:  332 ?QTC Calculation: 435 ?R Axis:   81 ?Text Interpretation: Sinus tachycardia Nonspecific T abnormalities, lateral leads No significant change since last tracing Confirmed by Fredia Sorrow [id number]) on 04/20/2022 9:06:52 PM ? ?Radiology ?DG Chest 2 View ? ?  Result Date: 04/20/2022 ?CLINICAL DATA:  Shortness of breath EXAM: CHEST - 2 VIEW COMPARISON:  10/06/2020 FINDINGS: The heart size and mediastinal contours are within normal limits. Both lungs are clear. The visualized skeletal structures are unremarkable. IMPRESSION: Normal study. Electronically Signed   By: Rolm Baptise M.D.   On: 04/20/2022 21:36   ? ?Procedures ?Procedures  ? ? ?Medications Ordered in ED ?Medications  ?HYDROcodone-acetaminophen (NORCO/VICODIN) 5-325 MG per tablet 1 tablet (has no administration in time range)  ? ? ?ED Course/ Medical Decision Making/ A&P ?  ?                        ?Medical Decision Making ?Risk ?Prescription drug management. ? ? ?Work-up for the shortness of breath chest x-ray negative.  Troponin normal at 3 CBC no leukocytosis hemoglobin 14 blood sugar on the basic metabolic panel is 669.  Creatinine up a little bit at 1.28.  Fingerstick blood sugar was 125 chest x-ray normal study.  EKG without any acute findings. ? ?Since no chest pain.  Delta troponin not required.  Plus patient's symptoms been ongoing for 3 days. ? ?Patient follow-up with Dr. Haig Prophet on-call dentist.  Will treat with penicillin and hydrocodone.  Blood sugars running on the high side we will have him follow-up with his primary care doctor for blood sugar rechecks and for blood pressure rechecks.  No adjustments required here tonight.  No evidence of any  acidosis. ? ? ?Final Clinical Impression(s) / ED Diagnoses ?Final diagnoses:  ?Pain, dental  ?Hyperglycemia  ?SOB (shortness of breath)  ? ? ?Rx / DC Orders ?ED Discharge Orders   ? ?      Ordered  ?  HYDROcodone-acetaminoph

## 2022-04-20 NOTE — ED Triage Notes (Signed)
Patient arrived via POV c/o shortness of breath x 3 days. Patient states feeling that's his hyperglycemia is acting up. Patient states shortness of breath is intermittent. Patient is AO x 4, VS with elevated BP, normal gait. ?

## 2022-04-20 NOTE — ED Notes (Signed)
Patient transported to X-ray 

## 2022-05-04 ENCOUNTER — Other Ambulatory Visit (HOSPITAL_BASED_OUTPATIENT_CLINIC_OR_DEPARTMENT_OTHER): Payer: Self-pay

## 2022-10-01 ENCOUNTER — Emergency Department (HOSPITAL_BASED_OUTPATIENT_CLINIC_OR_DEPARTMENT_OTHER)
Admission: EM | Admit: 2022-10-01 | Discharge: 2022-10-02 | Disposition: A | Discharge status: Home / Self Care | Payer: Self-pay | Attending: Emergency Medicine | Admitting: Emergency Medicine

## 2022-10-01 ENCOUNTER — Other Ambulatory Visit: Payer: Self-pay

## 2022-10-01 ENCOUNTER — Encounter (HOSPITAL_BASED_OUTPATIENT_CLINIC_OR_DEPARTMENT_OTHER): Payer: Self-pay | Admitting: Emergency Medicine

## 2022-10-01 DIAGNOSIS — Z7984 Long term (current) use of oral hypoglycemic drugs: Secondary | ICD-10-CM | POA: Insufficient documentation

## 2022-10-01 DIAGNOSIS — K0381 Cracked tooth: Secondary | ICD-10-CM | POA: Insufficient documentation

## 2022-10-01 DIAGNOSIS — K029 Dental caries, unspecified: Secondary | ICD-10-CM | POA: Insufficient documentation

## 2022-10-01 DIAGNOSIS — E1165 Type 2 diabetes mellitus with hyperglycemia: Secondary | ICD-10-CM | POA: Insufficient documentation

## 2022-10-01 LAB — CBG MONITORING, ED: Glucose-Capillary: 149 mg/dL — ABNORMAL HIGH (ref 70–99)

## 2022-10-01 MED ORDER — AMOXICILLIN-POT CLAVULANATE 875-125 MG PO TABS: 1.0000 | ORAL_TABLET | Freq: Two times a day (BID) | ORAL | 0 refills | Status: DC

## 2022-10-01 NOTE — ED Triage Notes (Signed)
Patient arrived via POV c/o hyperglycemia and dental pain. Patient unable to check blood sugar at home, CBG here was 149. Patient states dental pain in lower right jaw to midline. Patient unable to find dentist at this time. Patient states 10/10 pain. Patient is AO x 4, VS WDL, normal gait.

## 2022-10-01 NOTE — ED Provider Notes (Signed)
[facility] Provider Note   CSN: [id number] Arrival date & time: 10/01/22  2300     History  Chief Complaint  Patient presents with   Dental Pain    Craig Mclean is a 51 y.o. male with past medical history significant for diabetes, liver disease who presents with concern for bilateral jaw pain, concern for possible high blood sugar.  CBG on arrival 149.  Patient reports that the melphalan has been ongoing on and off for weeks, reports that he is having difficulty finding a dentist.  He denies any fever, chills, difficulty swallowing.   Dental Pain      Home Medications Prior to Admission medications   Medication Sig Start Date End Date Taking? Authorizing Provider  amoxicillin-clavulanate (AUGMENTIN) 875-125 MG tablet Take 1 tablet by mouth every 12 (twelve) hours. 10/01/22  Yes Prosperi, Christian H, PA-C  amLODipine (NORVASC) 10 MG tablet Take by mouth. 10/02/20   [provider]  atorvastatin (LIPITOR) 80 MG tablet Take by mouth. 10/02/20   [provider]  blood glucose meter kit and supplies KIT Dispense based on patient and insurance preference. Use up to four times daily as directed. (FOR ICD-9 250.00, 250.01). 01/15/18   Horton, Barbette Hair, MD  Diclofenac Sodium (PENNSAID) 2 % SOLN Place 1 application onto the skin 2 (two) times daily. 03/16/21   Rosemarie Ax, MD  Empagliflozin-metFORMIN HCl ER (SYNJARDY XR) 12.04-999 MG TB24 Take 2 tablets po qAM 05/19/20   [provider]  gabapentin (NEURONTIN) 100 MG capsule Take by mouth. 02/18/20   [provider]  glipiZIDE (GLUCOTROL) 5 MG tablet Take by mouth. 05/19/20   [provider]  HYDROcodone-acetaminophen (NORCO/VICODIN) 5-325 MG tablet Take 1 tablet by mouth every 6 (six) hours as needed for severe pain. 05/06/18   Blanchie Dessert, MD  HYDROcodone-acetaminophen (NORCO/VICODIN) 5-325 MG tablet Take 1 tablet by mouth every 6 (six) hours as needed for  moderate pain. 04/20/22   Fredia Sorrow, MD  ibuprofen (ADVIL) 800 MG tablet Take 1 tablet (800 mg total) by mouth every 8 (eight) hours as needed. 03/22/22   Long, Wonda Olds, MD  lisinopril-hydrochlorothiazide (ZESTORETIC) 20-25 MG tablet Take 1 tablet by mouth daily. 10/02/20   [provider]  metFORMIN (GLUCOPHAGE) 500 MG tablet Take 2 tablets (1,000 mg total) by mouth 2 (two) times daily with a meal. 01/15/18 02/14/18  Horton, Barbette Hair, MD  Semaglutide,0.25 or 0.5MG/DOS, (OZEMPIC, 0.25 OR 0.5 MG/DOSE,) 2 MG/1.5ML SOPN Inject 0.5 mg into the skin once a week. 12/07/20   [provider]  Semaglutide,0.25 or 0.5MG/DOS, (OZEMPIC, 0.25 OR 0.5 MG/DOSE,) 2 MG/1.5ML SOPN Inject 0.6m once a week 01/17/22         Allergies    Patient has no known allergies.    Review of Systems   Review of Systems  All other systems reviewed and are negative.   Physical Exam Updated Vital Signs BP (!) 143/95 (BP Location: Right Arm)   Pulse 84   Temp 98.4 F (36.9 C) (Oral)   Ht 5' 11" (1.803 m)   Wt 117.9 kg   SpO2 100%   BMI 36.26 kg/m  Physical Exam Vitals and nursing note reviewed.  Constitutional:      General: He is not in acute distress.    Appearance: Normal appearance.  HENT:     Head: Normocephalic and atraumatic.     Mouth/Throat:     Comments: No significant posterior oropharynx erythema, swelling, exudate. Uvula midline,  tonsils 1+ bilaterally.  No trismus, stridor, evidence of PTA, floor of mouth swelling or redness.   Patient with multiple caries, broken teeth bilaterally. Eyes:     General:        Right eye: No discharge.        Left eye: No discharge.  Cardiovascular:     Rate and Rhythm: Normal rate and regular rhythm.  Pulmonary:     Effort: Pulmonary effort is normal. No respiratory distress.  Musculoskeletal:        General: No deformity.  Skin:    General: Skin is warm and dry.  Neurological:     Mental Status: He is alert and oriented to person,  place, and time.  Psychiatric:        Mood and Affect: Mood normal.        Behavior: Behavior normal.     ED Results / Procedures / Treatments   Labs (all labs ordered are listed, but only abnormal results are displayed) Labs Reviewed  CBG MONITORING, ED - Abnormal; Notable for the following components:      Result Value   Glucose-Capillary 149 (*)    All other components within normal limits    EKG None  Radiology No results found.  Procedures Procedures    Medications Ordered in ED Medications - No data to display  ED Course/ Medical Decision Making/ A&P                           Medical Decision Making   This an overall well-appearing 51 y.o. , male who presents with concern for dental pain.  Physical exam reveals cavities, broken teeth in mouth bilaterally.  Patient with redness, gum swelling without evidence of gum abscess, periapical abscess, PTA, uvula deviation, pharyngitis, epiglottitis, dysphonia, stridor.  Patient without difficulty swallowing.  No systemic fever, chills.  Given patient's symptoms think it is reasonable to treat with antibiotics.  Encouraged ibuprofen, Tylenol, Orajel, ice for pain control. Considered stronger pain control, but will refrain at this time, as patient exhibiting pain med seeking behavior. Encouraged urgent dental follow-up, dental resource guide provided.  Provided Augmentin for antibiotic coverage.  Patient discharged in stable condition at this time, return precautions given.  Final Clinical Impression(s) / ED Diagnoses Final diagnoses:  Pain due to dental caries    Rx / DC Orders ED Discharge Orders          Ordered    amoxicillin-clavulanate (AUGMENTIN) 875-125 MG tablet  Every 12 hours        10/01/22 2347              Dorien Chihuahua 10/01/22 2349    Palumbo, April, MD 10/02/22 0013

## 2022-10-02 NOTE — ED Notes (Signed)
D/c paperwork reviewed with pt, including f/u care. All questions addressed prior to d/c. Ambulatory to ED exit, NAD.

## 2023-04-13 ENCOUNTER — Encounter: Payer: Self-pay | Admitting: *Deleted

## 2023-05-08 ENCOUNTER — Encounter (HOSPITAL_BASED_OUTPATIENT_CLINIC_OR_DEPARTMENT_OTHER): Payer: Self-pay | Admitting: Urology

## 2023-05-08 ENCOUNTER — Other Ambulatory Visit: Payer: Self-pay

## 2023-05-08 ENCOUNTER — Emergency Department (HOSPITAL_BASED_OUTPATIENT_CLINIC_OR_DEPARTMENT_OTHER)
Admission: EM | Admit: 2023-05-08 | Discharge: 2023-05-09 | Disposition: A | Discharge status: Home / Self Care | Payer: Self-pay | Attending: Emergency Medicine | Admitting: Emergency Medicine

## 2023-05-08 ENCOUNTER — Emergency Department (HOSPITAL_BASED_OUTPATIENT_CLINIC_OR_DEPARTMENT_OTHER): Payer: Self-pay

## 2023-05-08 DIAGNOSIS — Z7984 Long term (current) use of oral hypoglycemic drugs: Secondary | ICD-10-CM | POA: Insufficient documentation

## 2023-05-08 DIAGNOSIS — H538 Other visual disturbances: Secondary | ICD-10-CM | POA: Insufficient documentation

## 2023-05-08 DIAGNOSIS — E119 Type 2 diabetes mellitus without complications: Secondary | ICD-10-CM | POA: Insufficient documentation

## 2023-05-08 DIAGNOSIS — Z79899 Other long term (current) drug therapy: Secondary | ICD-10-CM | POA: Insufficient documentation

## 2023-05-08 DIAGNOSIS — K0889 Other specified disorders of teeth and supporting structures: Secondary | ICD-10-CM | POA: Insufficient documentation

## 2023-05-08 DIAGNOSIS — I10 Essential (primary) hypertension: Secondary | ICD-10-CM | POA: Insufficient documentation

## 2023-05-08 LAB — CBC WITH DIFFERENTIAL/PLATELET
Abs Immature Granulocytes: 0.01 10*3/uL (ref 0.00–0.07)
Basophils Absolute: 0 10*3/uL (ref 0.0–0.1)
Basophils Relative: 0 %
Eosinophils Absolute: 0.3 10*3/uL (ref 0.0–0.5)
Eosinophils Relative: 4 %
HCT: 39.9 % (ref 39.0–52.0)
Hemoglobin: 13 g/dL (ref 13.0–17.0)
Immature Granulocytes: 0 %
Lymphocytes Relative: 33 %
Lymphs Abs: 2.2 10*3/uL (ref 0.7–4.0)
MCH: 28.4 pg (ref 26.0–34.0)
MCHC: 32.6 g/dL (ref 30.0–36.0)
MCV: 87.3 fL (ref 80.0–100.0)
Monocytes Absolute: 0.5 10*3/uL (ref 0.1–1.0)
Monocytes Relative: 7 %
Neutro Abs: 3.7 10*3/uL (ref 1.7–7.7)
Neutrophils Relative %: 56 %
Platelets: 211 10*3/uL (ref 150–400)
RBC: 4.57 MIL/uL (ref 4.22–5.81)
RDW: 13.3 % (ref 11.5–15.5)
WBC: 6.7 10*3/uL (ref 4.0–10.5)
nRBC: 0 % (ref 0.0–0.2)

## 2023-05-08 LAB — COMPREHENSIVE METABOLIC PANEL
ALT: 32 U/L (ref 0–44)
AST: 26 U/L (ref 15–41)
Albumin: 3.9 g/dL (ref 3.5–5.0)
Alkaline Phosphatase: 124 U/L (ref 38–126)
Anion gap: 8 (ref 5–15)
BUN: 17 mg/dL (ref 6–20)
CO2: 26 mmol/L (ref 22–32)
Calcium: 8.7 mg/dL — ABNORMAL LOW (ref 8.9–10.3)
Chloride: 102 mmol/L (ref 98–111)
Creatinine, Ser: 1.01 mg/dL (ref 0.61–1.24)
GFR, Estimated: >60 mL/min (ref >60)
Glucose, Bld: 105 mg/dL — ABNORMAL HIGH (ref 70–99)
Potassium: 3.6 mmol/L (ref 3.5–5.1)
Sodium: 136 mmol/L (ref 135–145)
Total Bilirubin: 0.6 mg/dL (ref 0.3–1.2)
Total Protein: 7.8 g/dL (ref 6.5–8.1)

## 2023-05-08 LAB — CBG MONITORING, ED: Glucose-Capillary: 138 mg/dL — ABNORMAL HIGH (ref 70–99)

## 2023-05-08 MED ORDER — KETOROLAC TROMETHAMINE 60 MG/2ML IM SOLN
60.0000 mg | Freq: Once | INTRAMUSCULAR | Status: AC
  Administered 2023-05-08: 60 mg via INTRAMUSCULAR
  Filled 2023-05-08: qty 2

## 2023-05-08 NOTE — ED Notes (Signed)
Visual acuity completed per order. Pt reports cannot see at all out of left eye says it is too blurry. Does not wear glasses or contacts.

## 2023-05-08 NOTE — ED Provider Notes (Signed)
[place] [facility] Provider Note   CSN: [id number] Arrival date & time: 05/08/23  1758     History {Add pertinent medical, surgical, social history, OB history to HPI:1} Chief Complaint  Patient presents with   Dental Pain   Blurred Vision    Craig Mclean is a 52 y.o. male.  HPI     1PM had some vinegar and then began to have blurred vision  Have dentist appointment for pain in the mouth, has dentist appointment in 2 weeks. Had leftover augmentin from previous infection, started taking it a few days ago, helped it a little bit but now it is back again.     Past Medical History:  Diagnosis Date   Diabetes mellitus without complication (HCC)    Elevated LFTs    Erectile dysfunction    GERD (gastroesophageal reflux disease)    High cholesterol    Hypertension    NASH (nonalcoholic steatohepatitis)      Home Medications Prior to Admission medications   Medication Sig Start Date End Date Taking? Authorizing Provider  amLODipine (NORVASC) 10 MG tablet Take by mouth. 10/02/20   [provider]  amoxicillin-clavulanate (AUGMENTIN) 875-125 MG tablet Take 1 tablet by mouth every 12 (twelve) hours. 10/01/22   Prosperi, Christian H, PA-C  atorvastatin (LIPITOR) 80 MG tablet Take by mouth. 10/02/20   [provider]  blood glucose meter kit and supplies KIT Dispense based on patient and insurance preference. Use up to four times daily as directed. (FOR ICD-9 250.00, 250.01). 01/15/18   Horton, Mayer Masker, MD  Diclofenac Sodium (PENNSAID) 2 % SOLN Place 1 application onto the skin 2 (two) times daily. 03/16/21   Myra Rude, MD  Empagliflozin-metFORMIN HCl ER (SYNJARDY XR) 12.04-999 MG TB24 Take 2 tablets po qAM 05/19/20   [provider]  gabapentin (NEURONTIN) 100 MG capsule Take by mouth. 02/18/20   [provider]  glipiZIDE (GLUCOTROL) 5 MG tablet Take by mouth. 05/19/20   [provider]   HYDROcodone-acetaminophen (NORCO/VICODIN) 5-325 MG tablet Take 1 tablet by mouth every 6 (six) hours as needed for severe pain. 05/06/18   Gwyneth Sprout, MD  HYDROcodone-acetaminophen (NORCO/VICODIN) 5-325 MG tablet Take 1 tablet by mouth every 6 (six) hours as needed for moderate pain. 04/20/22   Vanetta Mulders, MD  ibuprofen (ADVIL) 800 MG tablet Take 1 tablet (800 mg total) by mouth every 8 (eight) hours as needed. 03/22/22   Long, Arlyss Repress, MD  lisinopril-hydrochlorothiazide (ZESTORETIC) 20-25 MG tablet Take 1 tablet by mouth daily. 10/02/20   [provider]  metFORMIN (GLUCOPHAGE) 500 MG tablet Take 2 tablets (1,000 mg total) by mouth 2 (two) times daily with a meal. 01/15/18 02/14/18  Horton, Mayer Masker, MD  Semaglutide,0.25 or 0.5MG /DOS, (OZEMPIC, 0.25 OR 0.5 MG/DOSE,) 2 MG/1.5ML SOPN Inject 0.5 mg into the skin once a week. 12/07/20   [provider]  Semaglutide,0.25 or 0.5MG /DOS, (OZEMPIC, 0.25 OR 0.5 MG/DOSE,) 2 MG/1.5ML SOPN Inject 0.5mg  once a week 01/17/22         Allergies    Patient has no known allergies.    Review of Systems   Review of Systems  Physical Exam Updated Vital Signs BP (!) 161/97   Pulse 75   Temp 98.3 F (36.8 C)   Resp 18   Ht 5\' 11"  (1.803 m)   Wt 117.9 kg   SpO2 99%   BMI 36.25 kg/m  Physical Exam  ED Results / Procedures /  Treatments   Labs (all labs ordered are listed, but only abnormal results are displayed) Labs Reviewed  CBG MONITORING, ED - Abnormal; Notable for the following components:      Result Value   Glucose-Capillary 138 (*)    All other components within normal limits    EKG None  Radiology No results found.  Procedures Procedures  {Document cardiac monitor, telemetry assessment procedure when appropriate:1}  Medications Ordered in ED Medications - No data to display  ED Course/ Medical Decision Making/ A&P   {   Click here for ABCD2, HEART and other calculatorsREFRESH Note before signing :1}                           Medical Decision Making  ***  {Document critical care time when appropriate:1} {Document review of labs and clinical decision tools ie heart score, Chads2Vasc2 etc:1}  {Document your independent review of radiology images, and any outside records:1} {Document your discussion with family members, caretakers, and with consultants:1} {Document social determinants of health affecting pt's care:1} {Document your decision making why or why not admission, treatments were needed:1} Final Clinical Impression(s) / ED Diagnoses Final diagnoses:  None    Rx / DC Orders ED Discharge Orders     None

## 2023-05-08 NOTE — ED Triage Notes (Signed)
Pt states dental pain/mouth pain x 3 days  States dentist appointment in 2 weeks but cannot wait that long for pain control  States drank some vinegar this morning and ate something sweet and started having blurry vision   H/o DM

## 2023-05-09 MED ORDER — OXYCODONE HCL 5 MG PO TABS: 5.0000 mg | ORAL_TABLET | ORAL | 0 refills | Status: AC | PRN

## 2023-05-09 MED ORDER — AMOXICILLIN-POT CLAVULANATE 875-125 MG PO TABS: 1.0000 | ORAL_TABLET | Freq: Two times a day (BID) | ORAL | 0 refills | Status: AC

## 2023-05-09 NOTE — ED Notes (Signed)
D/c paperwork reviewed with pt, including prescriptions and follow up care.  No questions or concerns voiced at time of d/c. . Pt verbalized understanding, Ambulatory without assistance to ED exit, NAD.   

## 2023-05-09 NOTE — Discharge Instructions (Addendum)
For your pain, you may take up to 1000mg of acetaminophen (tylenol) 4 times daily for up to a week. This is the maximum dose of acetminophen (tylenol) you can take from all sources. Please check other over-the-counter medications and prescriptions to ensure you are not taking other medications that contain acetaminophen.  You may also take ibuprofen 400 mg 6 times a day OR 600mg 4 times a day alternating with or at the same time as tylenol.  Take oxycodone as needed for breakthrough pain.  This medication can be addicting, sedating and cause constipation.    

## 2023-08-14 ENCOUNTER — Encounter (HOSPITAL_BASED_OUTPATIENT_CLINIC_OR_DEPARTMENT_OTHER): Payer: Self-pay

## 2023-08-14 ENCOUNTER — Emergency Department (HOSPITAL_BASED_OUTPATIENT_CLINIC_OR_DEPARTMENT_OTHER)
Admission: EM | Admit: 2023-08-14 | Discharge: 2023-08-14 | Disposition: A | Discharge status: Home / Self Care | Payer: Self-pay | Attending: Emergency Medicine | Admitting: Emergency Medicine

## 2023-08-14 ENCOUNTER — Other Ambulatory Visit: Payer: Self-pay

## 2023-08-14 DIAGNOSIS — W268XXA Contact with other sharp object(s), not elsewhere classified, initial encounter: Secondary | ICD-10-CM | POA: Insufficient documentation

## 2023-08-14 DIAGNOSIS — S61411A Laceration without foreign body of right hand, initial encounter: Secondary | ICD-10-CM | POA: Insufficient documentation

## 2023-08-14 DIAGNOSIS — R7309 Other abnormal glucose: Secondary | ICD-10-CM | POA: Insufficient documentation

## 2023-08-14 LAB — CBG MONITORING, ED: Glucose-Capillary: 170 mg/dL — ABNORMAL HIGH (ref 70–99)

## 2023-08-14 MED ORDER — MORPHINE SULFATE 15 MG PO TABS: 7.5000 mg | ORAL_TABLET | ORAL | 0 refills | Status: AC | PRN

## 2023-08-14 MED ORDER — LIDOCAINE-EPINEPHRINE (PF) 2 %-1:200000 IJ SOLN
20.0000 mL | Freq: Once | INTRAMUSCULAR | Status: AC
  Administered 2023-08-14: 20 mL
  Filled 2023-08-14: qty 20

## 2023-08-14 NOTE — ED Triage Notes (Signed)
Pt reports cutting R hand with knife while cleaning out his car. Bleeding controlled at this time.

## 2023-08-14 NOTE — ED Provider Notes (Signed)
[place] [facility] Provider Note   CSN: [id number] Arrival date & time: 08/14/23  1836     History  Chief Complaint  Patient presents with   Laceration    Craig Mclean is a 52 y.o. male.  52 yo M with a chief complaint of a laceration to palm of the right hand.  The patient was trying to clean his car and inadvertently stabbed himself in the hand.  Does have range of motion but is painful.  Denies other injury.  Thinks his tetanus is up-to-date.   Laceration      Home Medications Prior to Admission medications   Medication Sig Start Date End Date Taking? Authorizing Provider  morphine (MSIR) 15 MG tablet Take 0.5 tablets (7.5 mg total) by mouth every 4 (four) hours as needed for severe pain. 08/14/23  Yes Melene Plan, DO  amLODipine (NORVASC) 10 MG tablet Take by mouth. 10/02/20   [provider]  atorvastatin (LIPITOR) 80 MG tablet Take by mouth. 10/02/20   [provider]  blood glucose meter kit and supplies KIT Dispense based on patient and insurance preference. Use up to four times daily as directed. (FOR ICD-9 250.00, 250.01). 01/15/18   Horton, Mayer Masker, MD  Diclofenac Sodium (PENNSAID) 2 % SOLN Place 1 application onto the skin 2 (two) times daily. 03/16/21   Myra Rude, MD  Empagliflozin-metFORMIN HCl ER (SYNJARDY XR) 12.04-999 MG TB24 Take 2 tablets po qAM 05/19/20   [provider]  gabapentin (NEURONTIN) 100 MG capsule Take by mouth. 02/18/20   [provider]  glipiZIDE (GLUCOTROL) 5 MG tablet Take by mouth. 05/19/20   [provider]  ibuprofen (ADVIL) 800 MG tablet Take 1 tablet (800 mg total) by mouth every 8 (eight) hours as needed. 03/22/22   Long, Arlyss Repress, MD  lisinopril-hydrochlorothiazide (ZESTORETIC) 20-25 MG tablet Take 1 tablet by mouth daily. 10/02/20   [provider]  metFORMIN (GLUCOPHAGE) 500 MG tablet Take 2 tablets (1,000 mg total) by mouth 2 (two) times  daily with a meal. 01/15/18 02/14/18  Horton, Mayer Masker, MD  oxyCODONE (ROXICODONE) 5 MG immediate release tablet Take 1 tablet (5 mg total) by mouth every 4 (four) hours as needed for severe pain. 05/09/23   Alvira Monday, MD  Semaglutide,0.25 or 0.5MG /DOS, (OZEMPIC, 0.25 OR 0.5 MG/DOSE,) 2 MG/1.5ML SOPN Inject 0.5 mg into the skin once a week. 12/07/20   [provider]  Semaglutide,0.25 or 0.5MG /DOS, (OZEMPIC, 0.25 OR 0.5 MG/DOSE,) 2 MG/1.5ML SOPN Inject 0.5mg  once a week 01/17/22         Allergies    Patient has no known allergies.    Review of Systems   Review of Systems  Physical Exam Updated Vital Signs BP 133/84 (BP Location: Left Arm)   Pulse 86   Temp 98.5 F (36.9 C) (Oral)   Resp 19   Ht 5\' 11"  (1.803 m)   Wt 120.2 kg   SpO2 98%   BMI 36.96 kg/m  Physical Exam Vitals and nursing note reviewed.  Constitutional:      Appearance: He is well-developed.  HENT:     Head: Normocephalic and atraumatic.  Eyes:     Pupils: Pupils are equal, round, and reactive to light.  Neck:     Vascular: No JVD.  Cardiovascular:     Rate and Rhythm: Normal rate and regular rhythm.     Heart sounds: No murmur heard.    No friction rub. No  gallop.  Pulmonary:     Effort: No respiratory distress.     Breath sounds: No wheezing.  Abdominal:     General: There is no distension.     Tenderness: There is no abdominal tenderness. There is no guarding or rebound.  Musculoskeletal:        General: Normal range of motion.     Cervical back: Normal range of motion and neck supple.     Comments: Approximately 2 cm laceration to the palm of the right hand.  Patient is able to fully close the fist on that side though takes some effort.  Able to straighten his fingers without issue.  No drainage no obvious foreign body.  Skin:    Coloration: Skin is not pale.     Findings: No rash.  Neurological:     Mental Status: He is alert and oriented to person, place, and time.  Psychiatric:         Behavior: Behavior normal.     ED Results / Procedures / Treatments   Labs (all labs ordered are listed, but only abnormal results are displayed) Labs Reviewed  CBG MONITORING, ED - Abnormal; Notable for the following components:      Result Value   Glucose-Capillary 170 (*)    All other components within normal limits    EKG None  Radiology No results found.  Procedures .Marland KitchenLaceration Repair  Date/Time: 08/14/2023 10:18 PM  Performed by: Melene Plan, DO Authorized by: Melene Plan, DO   Consent:    Consent obtained:  Verbal   Consent given by:  Patient   Risks, benefits, and alternatives were discussed: yes     Risks discussed:  Infection, pain, poor cosmetic result and poor wound healing   Alternatives discussed:  No treatment Universal protocol:    Procedure explained and questions answered to patient or proxy's satisfaction: yes     Immediately prior to procedure, a time out was called: yes     Patient identity confirmed:  Verbally with patient Anesthesia:    Anesthesia method:  None Laceration details:    Location:  Hand   Hand location:  R palm   Length (cm):  2.7 Pre-procedure details:    Preparation:  Patient was prepped and draped in usual sterile fashion Exploration:    Hemostasis achieved with:  Epinephrine and direct pressure   Wound exploration: entire depth of wound visualized     Wound extent: no tendon damage   Treatment:    Area cleansed with:  Chlorhexidine   Amount of cleaning:  Standard   Irrigation solution:  Sterile saline   Irrigation volume:  50   Irrigation method:  Pressure wash   Debridement:  None   Undermining:  None   Scar revision: no   Skin repair:    Repair method:  Sutures   Suture size:  4-0   Suture material:  Nylon   Suture technique:  Simple interrupted   Number of sutures:  1 Approximation:    Approximation:  Close Repair type:    Repair type:  Simple Post-procedure details:    Dressing:  Antibiotic ointment  and adhesive bandage   Procedure completion:  Tolerated well, no immediate complications     Medications Ordered in ED Medications  lidocaine-EPINEPHrine (XYLOCAINE W/EPI) 2 %-1:200000 (PF) injection 20 mL (20 mLs Infiltration Given by Other 08/14/23 2010)    ED Course/ Medical Decision Making/ A&P  Medical Decision Making Risk Prescription drug management.   52 yo M with a chief complaint of a laceration to the right palm.  Patient said this was due to cleaning his car.  I am not sure that that fits the mechanism proposed though no obvious significant injury.  Full range of motion of the fingers.  Wound was likely superficial on exam.  No obvious foreign body.  Repaired at bedside.  He elected for no lidocaine.  Will discharge home.  PCP follow-up.  10:19 PM:  I have discussed the diagnosis/risks/treatment options with the patient.  Evaluation and diagnostic testing in the emergency department does not suggest an emergent condition requiring admission or immediate intervention beyond what has been performed at this time.  They will follow up with PCP. We also discussed returning to the ED immediately if new or worsening sx occur. We discussed the sx which are most concerning (e.g., sudden worsening pain, fever, inability to tolerate by mouth) that necessitate immediate return. Medications administered to the patient during their visit and any new prescriptions provided to the patient are listed below.  Medications given during this visit Medications  lidocaine-EPINEPHrine (XYLOCAINE W/EPI) 2 %-1:200000 (PF) injection 20 mL (20 mLs Infiltration Given by Other 08/14/23 2010)     The patient appears reasonably screen and/or stabilized for discharge and I doubt any other medical condition or other [facility] requiring further screening, evaluation, or treatment in the ED at this time prior to discharge.          Final Clinical Impression(s) / ED  Diagnoses Final diagnoses:  Laceration of right hand without foreign body, initial encounter    Rx / DC Orders ED Discharge Orders          Ordered    morphine (MSIR) 15 MG tablet  Every 4 hours PRN        08/14/23 2011              Melene Plan, DO 08/14/23 2219

## 2023-08-14 NOTE — Discharge Instructions (Signed)
Return for redness drainage or if you get a fever.  The area that was sutured was closed with nylon.  This needs to be removed.  This can be removed by a family doctor or urgent care or back in the Emergency Department if you so choose.  Usually this gets removed between day 7 and 10.  The area can get wet but not fully immersed underwater.  No scrubbing.  If you really want to clean it you can apply a half-and-half hydrogen peroxide solution with water on a Q-tip.  You can apply an ointment a couple times a day this could be as simple as Vaseline but could also be an antibiotic ointment if you wish.

## 2023-09-03 ENCOUNTER — Encounter (HOSPITAL_BASED_OUTPATIENT_CLINIC_OR_DEPARTMENT_OTHER): Payer: Self-pay | Admitting: *Deleted

## 2023-09-03 ENCOUNTER — Emergency Department (HOSPITAL_BASED_OUTPATIENT_CLINIC_OR_DEPARTMENT_OTHER)
Admission: EM | Admit: 2023-09-03 | Discharge: 2023-09-03 | Disposition: A | Discharge status: Home / Self Care | Payer: Self-pay | Attending: Emergency Medicine | Admitting: Emergency Medicine

## 2023-09-03 ENCOUNTER — Other Ambulatory Visit: Payer: Self-pay

## 2023-09-03 ENCOUNTER — Emergency Department (HOSPITAL_BASED_OUTPATIENT_CLINIC_OR_DEPARTMENT_OTHER): Payer: Self-pay

## 2023-09-03 DIAGNOSIS — R42 Dizziness and giddiness: Secondary | ICD-10-CM | POA: Insufficient documentation

## 2023-09-03 DIAGNOSIS — I10 Essential (primary) hypertension: Secondary | ICD-10-CM | POA: Insufficient documentation

## 2023-09-03 DIAGNOSIS — Z7984 Long term (current) use of oral hypoglycemic drugs: Secondary | ICD-10-CM | POA: Insufficient documentation

## 2023-09-03 DIAGNOSIS — R519 Headache, unspecified: Secondary | ICD-10-CM | POA: Insufficient documentation

## 2023-09-03 DIAGNOSIS — E119 Type 2 diabetes mellitus without complications: Secondary | ICD-10-CM | POA: Insufficient documentation

## 2023-09-03 DIAGNOSIS — Z79899 Other long term (current) drug therapy: Secondary | ICD-10-CM | POA: Insufficient documentation

## 2023-09-03 DIAGNOSIS — Z20822 Contact with and (suspected) exposure to covid-19: Secondary | ICD-10-CM | POA: Insufficient documentation

## 2023-09-03 LAB — COMPREHENSIVE METABOLIC PANEL
ALT: 38 U/L (ref 0–44)
AST: 34 U/L (ref 15–41)
Albumin: 3.9 g/dL (ref 3.5–5.0)
Alkaline Phosphatase: 133 U/L — ABNORMAL HIGH (ref 38–126)
Anion gap: 8 (ref 5–15)
BUN: 17 mg/dL (ref 6–20)
CO2: 25 mmol/L (ref 22–32)
Calcium: 8.6 mg/dL — ABNORMAL LOW (ref 8.9–10.3)
Chloride: 102 mmol/L (ref 98–111)
Creatinine, Ser: 1.06 mg/dL (ref 0.61–1.24)
GFR, Estimated: >60 mL/min (ref >60)
Glucose, Bld: 125 mg/dL — ABNORMAL HIGH (ref 70–99)
Potassium: 3.5 mmol/L (ref 3.5–5.1)
Sodium: 135 mmol/L (ref 135–145)
Total Bilirubin: 0.6 mg/dL (ref 0.3–1.2)
Total Protein: 7.7 g/dL (ref 6.5–8.1)

## 2023-09-03 LAB — CBG MONITORING, ED: Glucose-Capillary: 125 mg/dL — ABNORMAL HIGH (ref 70–99)

## 2023-09-03 LAB — CBC WITH DIFFERENTIAL/PLATELET
Abs Immature Granulocytes: 0.01 10*3/uL (ref 0.00–0.07)
Basophils Absolute: 0 10*3/uL (ref 0.0–0.1)
Basophils Relative: 0 %
Eosinophils Absolute: 0.2 10*3/uL (ref 0.0–0.5)
Eosinophils Relative: 3 %
HCT: 40.8 % (ref 39.0–52.0)
Hemoglobin: 13.2 g/dL (ref 13.0–17.0)
Immature Granulocytes: 0 %
Lymphocytes Relative: 34 %
Lymphs Abs: 2.1 10*3/uL (ref 0.7–4.0)
MCH: 28.5 pg (ref 26.0–34.0)
MCHC: 32.4 g/dL (ref 30.0–36.0)
MCV: 88.1 fL (ref 80.0–100.0)
Monocytes Absolute: 0.5 10*3/uL (ref 0.1–1.0)
Monocytes Relative: 8 %
Neutro Abs: 3.3 10*3/uL (ref 1.7–7.7)
Neutrophils Relative %: 55 %
Platelets: 215 10*3/uL (ref 150–400)
RBC: 4.63 MIL/uL (ref 4.22–5.81)
RDW: 12.8 % (ref 11.5–15.5)
WBC: 6.1 10*3/uL (ref 4.0–10.5)
nRBC: 0 % (ref 0.0–0.2)

## 2023-09-03 LAB — SARS CORONAVIRUS 2 BY RT PCR: SARS Coronavirus 2 by RT PCR: NEGATIVE

## 2023-09-03 MED ORDER — SODIUM CHLORIDE 0.9 % IV BOLUS
500.0000 mL | Freq: Once | INTRAVENOUS | Status: AC
  Administered 2023-09-03: 500 mL via INTRAVENOUS

## 2023-09-03 MED ORDER — IBUPROFEN 400 MG PO TABS
400.0000 mg | ORAL_TABLET | Freq: Once | ORAL | Status: AC
  Administered 2023-09-03: 400 mg via ORAL
  Filled 2023-09-03: qty 1

## 2023-09-03 NOTE — ED Notes (Signed)
Will attempt transport to imaging once orthostatics are finished.

## 2023-09-03 NOTE — ED Triage Notes (Signed)
Pt is here for dizziness and a slight headache since around 10am today.  Pt denies any focal weakenss, no facial droop.  Pt is diabetic but has not checked his blood sugar.

## 2023-09-03 NOTE — ED Notes (Signed)
Pt reports that he felt like the room was spinning and is was worse with movement

## 2023-09-03 NOTE — Discharge Instructions (Addendum)
Are seen in the emergency department for your headache and your dizziness.  You tested negative for COVID and your workup showed that you were in a normal heart rhythm with no signs of severe dehydration or anemia.  You may have another viral infection and you can continue to take Tylenol or Motrin as needed for your headaches and drink plenty of fluids and staying well-hydrated.  You can follow-up with your primary doctor in the next few days to have your symptoms rechecked.  You should return to the emergency department for significantly worsening headaches, fevers, if you are too dizzy to walk or you pass out or if you have any other new or concerning symptoms.

## 2023-09-03 NOTE — ED Provider Notes (Signed)
[place] [facility] Provider Note   CSN: [id number] Arrival date & time: 09/03/23  2016     History  Chief Complaint  Patient presents with   Dizziness    Craig Mclean is a 52 y.o. male.  Patient is a 52 year old male with a past medical history of hypertension and diabetes presenting to the emergency department with dizziness.  The patient states that this afternoon he started to feel lightheaded type of dizzy.  He states that the symptoms have been coming and going and seem to come on randomly.  He states he has had a mild associated headache and some mild shortness of breath.  He denies any fever, cough or congestion, runny nose or sore throat.  He denies any nausea, vomiting or diarrhea, black or bloody stools.  He denies any known sick contacts.  The history is provided by the patient.  Dizziness      Home Medications Prior to Admission medications   Medication Sig Start Date End Date Taking? Authorizing Provider  amLODipine (NORVASC) 10 MG tablet Take by mouth. 10/02/20   [provider]  atorvastatin (LIPITOR) 80 MG tablet Take by mouth. 10/02/20   [provider]  blood glucose meter kit and supplies KIT Dispense based on patient and insurance preference. Use up to four times daily as directed. (FOR ICD-9 250.00, 250.01). 01/15/18   Horton, Mayer Masker, MD  Diclofenac Sodium (PENNSAID) 2 % SOLN Place 1 application onto the skin 2 (two) times daily. 03/16/21   Myra Rude, MD  Empagliflozin-metFORMIN HCl ER (SYNJARDY XR) 12.04-999 MG TB24 Take 2 tablets po qAM 05/19/20   [provider]  gabapentin (NEURONTIN) 100 MG capsule Take by mouth. 02/18/20   [provider]  glipiZIDE (GLUCOTROL) 5 MG tablet Take by mouth. 05/19/20   [provider]  ibuprofen (ADVIL) 800 MG tablet Take 1 tablet (800 mg total) by mouth every 8 (eight) hours as needed. 03/22/22   Long, Arlyss Repress, MD   lisinopril-hydrochlorothiazide (ZESTORETIC) 20-25 MG tablet Take 1 tablet by mouth daily. 10/02/20   [provider]  metFORMIN (GLUCOPHAGE) 500 MG tablet Take 2 tablets (1,000 mg total) by mouth 2 (two) times daily with a meal. 01/15/18 02/14/18  Horton, Mayer Masker, MD  morphine (MSIR) 15 MG tablet Take 0.5 tablets (7.5 mg total) by mouth every 4 (four) hours as needed for severe pain. 08/14/23   Melene Plan, DO  oxyCODONE (ROXICODONE) 5 MG immediate release tablet Take 1 tablet (5 mg total) by mouth every 4 (four) hours as needed for severe pain. 05/09/23   Alvira Monday, MD  Semaglutide,0.25 or 0.5MG /DOS, (OZEMPIC, 0.25 OR 0.5 MG/DOSE,) 2 MG/1.5ML SOPN Inject 0.5 mg into the skin once a week. 12/07/20   [provider]  Semaglutide,0.25 or 0.5MG /DOS, (OZEMPIC, 0.25 OR 0.5 MG/DOSE,) 2 MG/1.5ML SOPN Inject 0.5mg  once a week 01/17/22         Allergies    Patient has no known allergies.    Review of Systems   Review of Systems  Neurological:  Positive for dizziness.    Physical Exam Updated Vital Signs BP (!) 155/101 (BP Location: Left Arm)   Pulse 85   Temp 98 F (36.7 C) (Oral)   Resp 17   SpO2 98%  Physical Exam Vitals and nursing note reviewed.  Constitutional:      General: He is not in acute distress.    Appearance: Normal appearance.  HENT:  Head: Normocephalic and atraumatic.     Nose: Nose normal.     Mouth/Throat:     Mouth: Mucous membranes are moist.     Pharynx: Oropharynx is clear.  Eyes:     Extraocular Movements: Extraocular movements intact.     Conjunctiva/sclera: Conjunctivae normal.     Pupils: Pupils are equal, round, and reactive to light.     Comments: No nystagmus on exam  Cardiovascular:     Rate and Rhythm: Normal rate and regular rhythm.     Heart sounds: Normal heart sounds.  Pulmonary:     Effort: Pulmonary effort is normal.     Breath sounds: Normal breath sounds.  Abdominal:     General: Abdomen is flat.     Palpations:  Abdomen is soft.     Tenderness: There is no abdominal tenderness.  Musculoskeletal:        General: Normal range of motion.     Cervical back: Normal range of motion and neck supple.  Skin:    General: Skin is warm and dry.  Neurological:     General: No focal deficit present.     Mental Status: He is alert and oriented to person, place, and time.     Cranial Nerves: No cranial nerve deficit.     Sensory: No sensory deficit.     Motor: No weakness.     Coordination: Coordination normal.  Psychiatric:        Mood and Affect: Mood normal.        Behavior: Behavior normal.     ED Results / Procedures / Treatments   Labs (all labs ordered are listed, but only abnormal results are displayed) Labs Reviewed  COMPREHENSIVE METABOLIC PANEL - Abnormal; Notable for the following components:      Result Value   Glucose, Bld 125 (*)    Calcium 8.6 (*)    Alkaline Phosphatase 133 (*)    All other components within normal limits  CBG MONITORING, ED - Abnormal; Notable for the following components:   Glucose-Capillary 125 (*)    All other components within normal limits  SARS CORONAVIRUS 2 BY RT PCR  CBC WITH DIFFERENTIAL/PLATELET    EKG EKG Interpretation Date/Time:  Sunday September 03 2023 20:57:48 EDT Ventricular Rate:  79 PR Interval:  154 QRS Duration:  96 QT Interval:  369 QTC Calculation: 423 R Axis:   45  Text Interpretation: Sinus rhythm Borderline T abnormalities, diffuse leads No significant change since last tracing Confirmed by Elayne Snare ([id number]) on 09/03/2023 9:21:49 PM  Radiology DG Chest 2 View  Result Date: 09/03/2023 CLINICAL DATA:  Shortness of breath and dizziness. EXAM: CHEST - 2 VIEW COMPARISON:  Chest radiograph dated 04/20/2022. FINDINGS: The heart size and mediastinal contours are within normal limits. Both lungs are clear. The visualized skeletal structures are unremarkable. IMPRESSION: No active cardiopulmonary disease. Electronically Signed   By:  Elgie Collard M.D.   On: 09/03/2023 22:40    Procedures Procedures    Medications Ordered in ED Medications  ibuprofen (ADVIL) tablet 400 mg (400 mg Oral Given 09/03/23 2107)  sodium chloride 0.9 % bolus 500 mL (500 mLs Intravenous New Bag/Given 09/03/23 2117)    ED Course/ Medical Decision Making/ A&P Clinical Course as of 09/03/23 2309  [facility] Sep 03, 2023  2239 Orthostatics negative. Labs within normal range. XR pending read. [VK]  2300 Patient reports significant improve of headache and dizziness. CXR without acute disease. He is stable for discharge home with  primary care follow up. [VK]    Clinical Course User Index [VK] Rexford Maus, DO                                 Medical Decision Making This patient presents to the ED with chief complaint(s) of dizziness with pertinent past medical history of diabetes, hypertension which further complicates the presenting complaint. The complaint involves an extensive differential diagnosis and also carries with it a high risk of complications and morbidity.    The differential diagnosis includes arrhythmia, anemia, dehydration, electrolyte abnormality, orthostatics, no room spinning sensation and no neurologic deficits making a vertigo or CVA less likely, viral syndrome  Additional history obtained: Additional history obtained from N/A Records reviewed Care Everywhere/External Records  ED Course and Reassessment: On patient's arrival he is hemodynamically stable in no acute distress.  EKG was performed on arrival that showed normal sinus rhythm without acute ischemic changes.  Patient will have labs, orthostatics and will be given fluids and return for symptomatic management and will be closely reassessed.  Independent labs interpretation:  The following labs were independently interpreted: within normal range  Independent visualization of imaging: - I independently visualized the following imaging with scope of interpretation  limited to determining acute life threatening conditions related to emergency care: CXR, which revealed no acute disease  Consultation: - Consulted or discussed management/test interpretation w/ external professional: N/A  Consideration for admission or further workup: Patient has no emergent conditions requiring admission or further work-up at this time and is stable for discharge home with primary care follow-up  Social Determinants of health: N/A    Amount and/or Complexity of Data Reviewed Labs: ordered. Radiology: ordered.  Risk Prescription drug management.          Final Clinical Impression(s) / ED Diagnoses Final diagnoses:  Dizziness  Acute nonintractable headache, unspecified headache type    Rx / DC Orders ED Discharge Orders     None         Rexford Maus, DO 09/03/23 2309

## 2023-09-03 NOTE — ED Notes (Signed)
Patient laid down flat for 10 min prior to orthostatic vitals. IV fluid withheld at this time until orthostatics are completed.

## 2023-09-25 ENCOUNTER — Emergency Department (HOSPITAL_BASED_OUTPATIENT_CLINIC_OR_DEPARTMENT_OTHER): Payer: BLUE CROSS/BLUE SHIELD

## 2023-09-25 ENCOUNTER — Encounter (HOSPITAL_BASED_OUTPATIENT_CLINIC_OR_DEPARTMENT_OTHER): Payer: Self-pay | Admitting: Emergency Medicine

## 2023-09-25 ENCOUNTER — Other Ambulatory Visit: Payer: Self-pay

## 2023-09-25 ENCOUNTER — Emergency Department (HOSPITAL_BASED_OUTPATIENT_CLINIC_OR_DEPARTMENT_OTHER)
Admission: EM | Admit: 2023-09-25 | Discharge: 2023-09-25 | Disposition: A | Discharge status: Home / Self Care | Payer: BLUE CROSS/BLUE SHIELD | Attending: Emergency Medicine | Admitting: Emergency Medicine

## 2023-09-25 DIAGNOSIS — Z7984 Long term (current) use of oral hypoglycemic drugs: Secondary | ICD-10-CM | POA: Insufficient documentation

## 2023-09-25 DIAGNOSIS — E119 Type 2 diabetes mellitus without complications: Secondary | ICD-10-CM | POA: Insufficient documentation

## 2023-09-25 DIAGNOSIS — Z79899 Other long term (current) drug therapy: Secondary | ICD-10-CM | POA: Diagnosis not present

## 2023-09-25 DIAGNOSIS — I10 Essential (primary) hypertension: Secondary | ICD-10-CM | POA: Diagnosis not present

## 2023-09-25 DIAGNOSIS — R42 Dizziness and giddiness: Secondary | ICD-10-CM | POA: Insufficient documentation

## 2023-09-25 LAB — CBC WITH DIFFERENTIAL/PLATELET
Abs Immature Granulocytes: 0.01 10*3/uL (ref 0.00–0.07)
Basophils Absolute: 0 10*3/uL (ref 0.0–0.1)
Basophils Relative: 0 %
Eosinophils Absolute: 0.3 10*3/uL (ref 0.0–0.5)
Eosinophils Relative: 3 %
HCT: 42.3 % (ref 39.0–52.0)
Hemoglobin: 13.5 g/dL (ref 13.0–17.0)
Immature Granulocytes: 0 %
Lymphocytes Relative: 21 %
Lymphs Abs: 1.6 10*3/uL (ref 0.7–4.0)
MCH: 28 pg (ref 26.0–34.0)
MCHC: 31.9 g/dL (ref 30.0–36.0)
MCV: 87.6 fL (ref 80.0–100.0)
Monocytes Absolute: 0.8 10*3/uL (ref 0.1–1.0)
Monocytes Relative: 10 %
Neutro Abs: 4.9 10*3/uL (ref 1.7–7.7)
Neutrophils Relative %: 66 %
Platelets: 153 10*3/uL (ref 150–400)
RBC: 4.83 MIL/uL (ref 4.22–5.81)
RDW: 12.9 % (ref 11.5–15.5)
WBC: 7.7 10*3/uL (ref 4.0–10.5)
nRBC: 0 % (ref 0.0–0.2)

## 2023-09-25 LAB — BASIC METABOLIC PANEL
Anion gap: 11 (ref 5–15)
BUN: 17 mg/dL (ref 6–20)
CO2: 27 mmol/L (ref 22–32)
Calcium: 9.4 mg/dL (ref 8.9–10.3)
Chloride: 100 mmol/L (ref 98–111)
Creatinine, Ser: 1.1 mg/dL (ref 0.61–1.24)
GFR, Estimated: >60 mL/min (ref >60)
Glucose, Bld: 120 mg/dL — ABNORMAL HIGH (ref 70–99)
Potassium: 3.8 mmol/L (ref 3.5–5.1)
Sodium: 138 mmol/L (ref 135–145)

## 2023-09-25 LAB — CBG MONITORING, ED: Glucose-Capillary: 136 mg/dL — ABNORMAL HIGH (ref 70–99)

## 2023-09-25 MED ORDER — SODIUM CHLORIDE 0.9 % IV BOLUS
1000.0000 mL | Freq: Once | INTRAVENOUS | Status: AC
  Administered 2023-09-25: 1000 mL via INTRAVENOUS

## 2023-09-25 MED ORDER — MECLIZINE HCL 25 MG PO TABS: 25.0000 mg | ORAL_TABLET | Freq: Three times a day (TID) | ORAL | 0 refills | Status: AC | PRN

## 2023-09-25 MED ORDER — DIAZEPAM 5 MG/ML IJ SOLN
5.0000 mg | Freq: Once | INTRAMUSCULAR | Status: AC
  Administered 2023-09-25: 5 mg via INTRAVENOUS
  Filled 2023-09-25: qty 2

## 2023-09-25 MED ORDER — MECLIZINE HCL 25 MG PO TABS
25.0000 mg | ORAL_TABLET | Freq: Once | ORAL | Status: AC
  Administered 2023-09-25: 25 mg via ORAL
  Filled 2023-09-25: qty 1

## 2023-09-25 NOTE — ED Notes (Signed)
Patient transported to CT 

## 2023-09-25 NOTE — ED Notes (Signed)
Patient states he feels dizzy still. Rescind discharge.

## 2023-09-25 NOTE — Discharge Instructions (Signed)
Begin taking meclizine as prescribed.  Follow-up with primary doctor if not improving in the next few days, and return to the ER if symptoms significantly worsen or change.

## 2023-09-25 NOTE — ED Provider Notes (Addendum)
[place] [facility] Provider Note   CSN: [id number] Arrival date & time: 09/25/23  0147     History  Chief Complaint  Patient presents with   Dizziness    Burnette Sautter is a 52 y.o. male.  Patient is a 52 year old male with past medical history of type 2 diabetes, hypertension, hyperlipidemia.  Patient presenting today with complaints of dizziness.  Symptoms started earlier this afternoon.  He describes this as a spinning sensation that is worse when he ambulates and moves.  He reports some jaw pain and headache.  No visual disturbances.  He denies any weakness or numbness.  No ear pressure or ringing in the ears.  Patient here with similar complaints several weeks ago.  He was given IV fluids and symptoms seem to resolve.  The history is provided by the patient.       Home Medications Prior to Admission medications   Medication Sig Start Date End Date Taking? Authorizing Provider  amLODipine (NORVASC) 10 MG tablet Take by mouth. 10/02/20   [provider]  atorvastatin (LIPITOR) 80 MG tablet Take by mouth. 10/02/20   [provider]  blood glucose meter kit and supplies KIT Dispense based on patient and insurance preference. Use up to four times daily as directed. (FOR ICD-9 250.00, 250.01). 01/15/18   Horton, Mayer Masker, MD  Diclofenac Sodium (PENNSAID) 2 % SOLN Place 1 application onto the skin 2 (two) times daily. 03/16/21   Myra Rude, MD  Empagliflozin-metFORMIN HCl ER (SYNJARDY XR) 12.04-999 MG TB24 Take 2 tablets po qAM 05/19/20   [provider]  gabapentin (NEURONTIN) 100 MG capsule Take by mouth. 02/18/20   [provider]  glipiZIDE (GLUCOTROL) 5 MG tablet Take by mouth. 05/19/20   [provider]  ibuprofen (ADVIL) 800 MG tablet Take 1 tablet (800 mg total) by mouth every 8 (eight) hours as needed. 03/22/22   Long, Arlyss Repress, MD  lisinopril-hydrochlorothiazide (ZESTORETIC) 20-25 MG tablet  Take 1 tablet by mouth daily. 10/02/20   [provider]  metFORMIN (GLUCOPHAGE) 500 MG tablet Take 2 tablets (1,000 mg total) by mouth 2 (two) times daily with a meal. 01/15/18 02/14/18  Horton, Mayer Masker, MD  morphine (MSIR) 15 MG tablet Take 0.5 tablets (7.5 mg total) by mouth every 4 (four) hours as needed for severe pain. 08/14/23   Melene Plan, DO  oxyCODONE (ROXICODONE) 5 MG immediate release tablet Take 1 tablet (5 mg total) by mouth every 4 (four) hours as needed for severe pain. 05/09/23   Alvira Monday, MD  Semaglutide,0.25 or 0.5MG /DOS, (OZEMPIC, 0.25 OR 0.5 MG/DOSE,) 2 MG/1.5ML SOPN Inject 0.5 mg into the skin once a week. 12/07/20   [provider]  Semaglutide,0.25 or 0.5MG /DOS, (OZEMPIC, 0.25 OR 0.5 MG/DOSE,) 2 MG/1.5ML SOPN Inject 0.5mg  once a week 01/17/22         Allergies    Patient has no known allergies.    Review of Systems   Review of Systems  All other systems reviewed and are negative.   Physical Exam Updated Vital Signs BP (!) 147/102   Pulse 100   Temp 98.2 F (36.8 C) (Oral)   Resp (!) 22   Ht 5\' 11"  (1.803 m)   Wt 117.9 kg   SpO2 98%   BMI 36.26 kg/m  Physical Exam Vitals and nursing note reviewed.  Constitutional:      General: He is not in acute distress.    Appearance: He is  well-developed. He is not diaphoretic.  HENT:     Head: Normocephalic and atraumatic.  Eyes:     Extraocular Movements: Extraocular movements intact.     Pupils: Pupils are equal, round, and reactive to light.  Cardiovascular:     Rate and Rhythm: Normal rate and regular rhythm.     Heart sounds: No murmur heard.    No friction rub.  Pulmonary:     Effort: Pulmonary effort is normal. No respiratory distress.     Breath sounds: Normal breath sounds. No wheezing or rales.  Abdominal:     General: Bowel sounds are normal. There is no distension.     Palpations: Abdomen is soft.     Tenderness: There is no abdominal tenderness.  Musculoskeletal:         General: Normal range of motion.     Cervical back: Normal range of motion and neck supple.  Skin:    General: Skin is warm and dry.  Neurological:     General: No focal deficit present.     Mental Status: He is alert and oriented to person, place, and time.     Cranial Nerves: No cranial nerve deficit.     Motor: No weakness.     Coordination: Coordination normal.     ED Results / Procedures / Treatments   Labs (all labs ordered are listed, but only abnormal results are displayed) Labs Reviewed  CBG MONITORING, ED - Abnormal; Notable for the following components:      Result Value   Glucose-Capillary 136 (*)    All other components within normal limits  BASIC METABOLIC PANEL  CBC WITH DIFFERENTIAL/PLATELET    EKG EKG Interpretation Date/Time:  Monday September 25 2023 01:58:26 EDT Ventricular Rate:  102 PR Interval:  157 QRS Duration:  86 QT Interval:  300 QTC Calculation: 391 R Axis:   84  Text Interpretation: Sinus tachycardia Nonspecific repol abnormality, inferior leads No significant change since 09/03/2023 Confirmed by Geoffery Lyons ([id number]) on 09/25/2023 2:00:28 AM  Radiology No results found.  Procedures Procedures    Medications Ordered in ED Medications  sodium chloride 0.9 % bolus 1,000 mL (has no administration in time range)  meclizine (ANTIVERT) tablet 25 mg (has no administration in time range)    ED Course/ Medical Decision Making/ A&P  Patient is a 52 year old male presenting with dizziness as described in HPI.  Patient arrives with stable vital signs and is clinically well-appearing.  He is neurologically intact on exam.  Laboratory studies obtained including CBC and basic metabolic panel, both of which were unremarkable.  The patient has been hydrated with normal saline and given meclizine with significant relief of his symptoms.  I highly suspect your symptoms are related to a peripheral vertigo due to the waxing and waning nature of his  symptoms and exacerbation with movement and change in position.  Patient to be discharged with meclizine and as needed return.  ADDENDUM: As patient was attempting to leave, he stated that he felt dizzy once again.  Patient was given IV Valium and observed for longer period of time.  He now states he is feeling markedly improved.  Head CT was obtained showing no acute process.  Patient to be discharged, but understands to return as needed if symptoms worsen.  Final Clinical Impression(s) / ED Diagnoses Final diagnoses:  None    Rx / DC Orders ED Discharge Orders     None         Srihaan Mastrangelo, Riley Lam,  MD 09/25/23 0737    Geoffery Lyons, MD 09/25/23 [phone]

## 2023-09-25 NOTE — ED Triage Notes (Signed)
Reports dizziness that started around 1630 yesterday. He describes it as "spinning". He states he feels unsteady walking. He endorses dizziness now, states it started around 2 hours ago. CBG 136. HA that started tonight. He was unable to sleep so he came to get checked. Denies [facility], reports R jaw pain that started Friday also. Pt states he believes it to be dental related. EDP aware of complaint.

## 2024-01-25 ENCOUNTER — Other Ambulatory Visit: Payer: Self-pay

## 2024-01-25 ENCOUNTER — Emergency Department (HOSPITAL_BASED_OUTPATIENT_CLINIC_OR_DEPARTMENT_OTHER)
Admission: EM | Admit: 2024-01-25 | Discharge: 2024-01-25 | Disposition: A | Discharge status: Home / Self Care | Payer: BLUE CROSS/BLUE SHIELD | Attending: Emergency Medicine | Admitting: Emergency Medicine

## 2024-01-25 ENCOUNTER — Encounter (HOSPITAL_BASED_OUTPATIENT_CLINIC_OR_DEPARTMENT_OTHER): Payer: Self-pay

## 2024-01-25 DIAGNOSIS — J101 Influenza due to other identified influenza virus with other respiratory manifestations: Secondary | ICD-10-CM | POA: Insufficient documentation

## 2024-01-25 DIAGNOSIS — E871 Hypo-osmolality and hyponatremia: Secondary | ICD-10-CM | POA: Insufficient documentation

## 2024-01-25 DIAGNOSIS — Z79899 Other long term (current) drug therapy: Secondary | ICD-10-CM | POA: Insufficient documentation

## 2024-01-25 DIAGNOSIS — Z20822 Contact with and (suspected) exposure to covid-19: Secondary | ICD-10-CM | POA: Insufficient documentation

## 2024-01-25 DIAGNOSIS — I10 Essential (primary) hypertension: Secondary | ICD-10-CM | POA: Insufficient documentation

## 2024-01-25 DIAGNOSIS — R059 Cough, unspecified: Secondary | ICD-10-CM | POA: Diagnosis present

## 2024-01-25 LAB — LIPASE, BLOOD: Lipase: 36 U/L (ref 11–51)

## 2024-01-25 LAB — COMPREHENSIVE METABOLIC PANEL
ALT: 41 U/L (ref 0–44)
AST: 39 U/L (ref 15–41)
Albumin: 4.1 g/dL (ref 3.5–5.0)
Alkaline Phosphatase: 122 U/L (ref 38–126)
Anion gap: 6 (ref 5–15)
BUN: 16 mg/dL (ref 6–20)
CO2: 26 mmol/L (ref 22–32)
Calcium: 8.8 mg/dL — ABNORMAL LOW (ref 8.9–10.3)
Chloride: 101 mmol/L (ref 98–111)
Creatinine, Ser: 1.13 mg/dL (ref 0.61–1.24)
GFR, Estimated: >60 mL/min (ref >60)
Glucose, Bld: 136 mg/dL — ABNORMAL HIGH (ref 70–99)
Potassium: 3.7 mmol/L (ref 3.5–5.1)
Sodium: 133 mmol/L — ABNORMAL LOW (ref 135–145)
Total Bilirubin: 0.8 mg/dL (ref 0.0–1.2)
Total Protein: 8.4 g/dL — ABNORMAL HIGH (ref 6.5–8.1)

## 2024-01-25 LAB — URINALYSIS, ROUTINE W REFLEX MICROSCOPIC
Bilirubin Urine: NEGATIVE
Glucose, UA: NEGATIVE mg/dL
Hgb urine dipstick: NEGATIVE
Ketones, ur: NEGATIVE mg/dL
Leukocytes,Ua: NEGATIVE
Nitrite: NEGATIVE
Protein, ur: 100 mg/dL — AB
Specific Gravity, Urine: >=1.03 (ref 1.005–1.030)
pH: 6 (ref 5.0–8.0)

## 2024-01-25 LAB — RESP PANEL BY RT-PCR (RSV, FLU A&B, COVID)  RVPGX2
Influenza A by PCR: POSITIVE — AB
Influenza B by PCR: NEGATIVE
Resp Syncytial Virus by PCR: NEGATIVE
SARS Coronavirus 2 by RT PCR: NEGATIVE

## 2024-01-25 LAB — URINALYSIS, MICROSCOPIC (REFLEX)
RBC / HPF: NONE SEEN RBC/hpf (ref 0–5)
WBC, UA: NONE SEEN WBC/hpf (ref 0–5)

## 2024-01-25 LAB — CBC
HCT: 42.8 % (ref 39.0–52.0)
Hemoglobin: 13.8 g/dL (ref 13.0–17.0)
MCH: 28.5 pg (ref 26.0–34.0)
MCHC: 32.2 g/dL (ref 30.0–36.0)
MCV: 88.2 fL (ref 80.0–100.0)
Platelets: 190 10*3/uL (ref 150–400)
RBC: 4.85 MIL/uL (ref 4.22–5.81)
RDW: 12.8 % (ref 11.5–15.5)
WBC: 4.9 10*3/uL (ref 4.0–10.5)
nRBC: 0 % (ref 0.0–0.2)

## 2024-01-25 LAB — CBG MONITORING, ED: Glucose-Capillary: 120 mg/dL — ABNORMAL HIGH (ref 70–99)

## 2024-01-25 MED ORDER — BENZONATATE 100 MG PO CAPS
100.0000 mg | ORAL_CAPSULE | Freq: Three times a day (TID) | ORAL | 0 refills | Status: AC
Start: 2024-01-25 — End: ?

## 2024-01-25 MED ORDER — KETOROLAC TROMETHAMINE 15 MG/ML IJ SOLN: 15.0000 mg | Freq: Once | INTRAMUSCULAR | Status: DC

## 2024-01-25 MED ORDER — BENZONATATE 100 MG PO CAPS
200.0000 mg | ORAL_CAPSULE | Freq: Once | ORAL | Status: AC
  Administered 2024-01-25: 200 mg via ORAL
  Filled 2024-01-25: qty 2

## 2024-01-25 MED ORDER — ACETAMINOPHEN 325 MG PO TABS
650.0000 mg | ORAL_TABLET | Freq: Once | ORAL | Status: AC
  Administered 2024-01-25: 650 mg via ORAL
  Filled 2024-01-25: qty 2

## 2024-01-25 NOTE — Discharge Instructions (Signed)
You tested positive for influenza today.  This is likely the source of your symptoms.  You can take over-the-counter Tylenol and or ibuprofen for your headache and symptoms.  I am going to write you a prescription for Tessalon pearls to help with the cough.  Please take as prescribed.  You may return to the emergency department for any worsening symptoms.

## 2024-01-25 NOTE — ED Triage Notes (Signed)
Patient arrives with complaints of headache, sore throat, diarrhea, and indigestion x1 day. Patient rates his headache a 10/10.

## 2024-01-25 NOTE — ED Provider Notes (Signed)
[place] [facility] Provider Note   CSN: [id number] Arrival date & time: 01/25/24  1316     History Chief Complaint  Patient presents with   Diarrhea   Headache   Sore Throat    Craig Mclean is a 53 y.o. male with history of hypertension, Craig Mclean, and hyperlipidemia who presents to the emergency department with a 1 day history of fatigue, cough, and headache.  He denies any sick contacts and lives alone.  He denies abdominal pain, chest pain, shortness of breath.  Cough is nonproductive.  He has not taken any medications over-the-counter.  Cough is worse at night.   Diarrhea Associated symptoms: headaches   Headache Associated symptoms: diarrhea   Sore Throat Associated symptoms include headaches.       Home Medications Prior to Admission medications   Medication Sig Start Date End Date Taking? Authorizing Provider  benzonatate (TESSALON) 100 MG capsule Take 1 capsule (100 mg total) by mouth every 8 (eight) hours. 01/25/24  Yes Meredeth Ide, Lorelle Macaluso M, PA-C  amLODipine (NORVASC) 10 MG tablet Take by mouth. 10/02/20   [provider]  atorvastatin (LIPITOR) 80 MG tablet Take by mouth. 10/02/20   [provider]  blood glucose meter kit and supplies KIT Dispense based on patient and insurance preference. Use up to four times daily as directed. (FOR ICD-9 250.00, 250.01). 01/15/18   Horton, Mayer Masker, MD  Diclofenac Sodium (PENNSAID) 2 % SOLN Place 1 application onto the skin 2 (two) times daily. 03/16/21   Myra Rude, MD  Empagliflozin-metFORMIN HCl ER (SYNJARDY XR) 12.04-999 MG TB24 Take 2 tablets po qAM 05/19/20   [provider]  gabapentin (NEURONTIN) 100 MG capsule Take by mouth. 02/18/20   [provider]  glipiZIDE (GLUCOTROL) 5 MG tablet Take by mouth. 05/19/20   [provider]  ibuprofen (ADVIL) 800 MG tablet Take 1 tablet (800 mg total) by mouth every 8 (eight) hours as needed. 03/22/22   Long,  Arlyss Repress, MD  lisinopril-hydrochlorothiazide (ZESTORETIC) 20-25 MG tablet Take 1 tablet by mouth daily. 10/02/20   [provider]  meclizine (ANTIVERT) 25 MG tablet Take 1 tablet (25 mg total) by mouth 3 (three) times daily as needed for dizziness. 09/25/23   Geoffery Lyons, MD  metFORMIN (GLUCOPHAGE) 500 MG tablet Take 2 tablets (1,000 mg total) by mouth 2 (two) times daily with a meal. 01/15/18 02/14/18  Horton, Mayer Masker, MD  morphine (MSIR) 15 MG tablet Take 0.5 tablets (7.5 mg total) by mouth every 4 (four) hours as needed for severe pain. 08/14/23   Melene Plan, DO  oxyCODONE (ROXICODONE) 5 MG immediate release tablet Take 1 tablet (5 mg total) by mouth every 4 (four) hours as needed for severe pain. 05/09/23   Alvira Monday, MD  Semaglutide,0.25 or 0.5MG /DOS, (OZEMPIC, 0.25 OR 0.5 MG/DOSE,) 2 MG/1.5ML SOPN Inject 0.5 mg into the skin once a week. 12/07/20   [provider]  Semaglutide,0.25 or 0.5MG /DOS, (OZEMPIC, 0.25 OR 0.5 MG/DOSE,) 2 MG/1.5ML SOPN Inject 0.5mg  once a week 01/17/22         Allergies    Patient has no known allergies.    Review of Systems   Review of Systems  Gastrointestinal:  Positive for diarrhea.  Neurological:  Positive for headaches.  All other systems reviewed and are negative.   Physical Exam Updated Vital Signs BP (!) 146/92 (BP Location: Right Arm)   Pulse (!) 104   Temp 99.4 F (37.4 C)  Resp (!) 24   Ht 5\' 11"  (1.803 m)   Wt 117.9 kg   SpO2 98%   BMI 36.26 kg/m  Physical Exam Vitals and nursing note reviewed.  Constitutional:      General: He is not in acute distress.    Appearance: Normal appearance.  HENT:     Head: Normocephalic and atraumatic.  Eyes:     General:        Right eye: No discharge.        Left eye: No discharge.  Cardiovascular:     Rate and Rhythm: Tachycardia present. No extrasystoles are present.    Pulses: No decreased pulses.     Heart sounds: Normal heart sounds.  Pulmonary:     Comments: Clear  to auscultation bilaterally.  Normal effort.  No respiratory distress.  No evidence of wheezes, rales, or rhonchi heard throughout. Abdominal:     General: Abdomen is flat. Bowel sounds are normal. There is no distension.     Tenderness: There is no abdominal tenderness. There is no guarding or rebound.  Musculoskeletal:        General: Normal range of motion.     Cervical back: Neck supple.  Skin:    General: Skin is warm and dry.     Findings: No rash.  Neurological:     General: No focal deficit present.     Mental Status: He is alert.  Psychiatric:        Mood and Affect: Mood normal.        Behavior: Behavior normal.     ED Results / Procedures / Treatments   Labs (all labs ordered are listed, but only abnormal results are displayed) Labs Reviewed  RESP PANEL BY RT-PCR (RSV, FLU A&B, COVID)  RVPGX2 - Abnormal; Notable for the following components:      Result Value   Influenza A by PCR POSITIVE (*)    All other components within normal limits  COMPREHENSIVE METABOLIC PANEL - Abnormal; Notable for the following components:   Sodium 133 (*)    Glucose, Bld 136 (*)    Calcium 8.8 (*)    Total Protein 8.4 (*)    All other components within normal limits  CBG MONITORING, ED - Abnormal; Notable for the following components:   Glucose-Capillary 120 (*)    All other components within normal limits  LIPASE, BLOOD  CBC  URINALYSIS, ROUTINE W REFLEX MICROSCOPIC    EKG None  Radiology No results found.  Procedures Procedures    Medications Ordered in ED Medications  benzonatate (TESSALON) capsule 200 mg (200 mg Oral Given 01/25/24 1816)  acetaminophen (TYLENOL) tablet 650 mg (650 mg Oral Given 01/25/24 1816)    ED Course/ Medical Decision Making/ A&P Clinical Course as of 01/25/24 1820  Thu Jan 25, 2024  1815 CBC Negative. [CF]  1816 Lipase, blood Negative. [CF]  1816 Comprehensive metabolic panel(!) Mild hyponatremia and slightly elevated glucose. [CF]  1817  Resp panel by RT-PCR (RSV, Flu A&B, Covid) Anterior Nasal Swab(!) Positive for influenza A. [CF]    Clinical Course User Index [CF] Teressa Lower, PA-C   {   Click here for ABCD2, HEART and other calculators  Medical Decision Making Craig Madewell is a 53 y.o. male patient who presents to the emergency department today for further evaluation of headache, cough, and general fatigue.  Patient did test positive for influenza which is likely the source of his symptoms.  His intake vital signs does show that  he is tachycardic and tachypneic.  During my interview and physical exam patient's breath sounds are completely normal and he is not in acute distress.  He does have a low-grade fever which could explain this mild tachycardia.  Nonetheless, patient is resting comfortably in the emergency department.  I going to give him some Tylenol for his headache and a Tessalon Perle 200 mg to help with the cough.  I will give him a prescription of Tessalon Perles to go home with and we will treat this conservatively.  The rest of his lab work looks great.  Strict turn precautions were discussed.  He is safe for discharge.   Amount and/or Complexity of Data Reviewed Labs: ordered.  Risk OTC drugs. Prescription drug management.    Final Clinical Impression(s) / ED Diagnoses Final diagnoses:  Influenza A    Rx / DC Orders ED Discharge Orders          Ordered    benzonatate (TESSALON) 100 MG capsule  Every 8 hours        01/25/24 1820              Honor Loh Chauncey, [place] 01/25/24 1820    Vanetta Mulders, MD 01/26/24 [phone]

## 2024-01-25 NOTE — ED Notes (Signed)

## 2024-01-25 NOTE — ED Notes (Signed)
Cannot discharge, registration in chart.
